# Patient Record
Sex: Female | Born: 1945 | Race: White | Hispanic: No | Smoking: Current every day smoker
Health system: Southern US, Community
[De-identification: ages and names within clinical notes are randomized; demographics above are authoritative.]

## PROBLEM LIST (undated history)

## (undated) DIAGNOSIS — F32A Depression, unspecified: Secondary | ICD-10-CM

## (undated) DIAGNOSIS — I480 Paroxysmal atrial fibrillation: Secondary | ICD-10-CM

## (undated) DIAGNOSIS — E119 Type 2 diabetes mellitus without complications: Secondary | ICD-10-CM

## (undated) DIAGNOSIS — I10 Essential (primary) hypertension: Secondary | ICD-10-CM

## (undated) DIAGNOSIS — F329 Major depressive disorder, single episode, unspecified: Secondary | ICD-10-CM

## (undated) HISTORY — DX: Paroxysmal atrial fibrillation: I48.0

## (undated) HISTORY — DX: Type 2 diabetes mellitus without complications: E11.9

## (undated) HISTORY — DX: Depression, unspecified: F32.A

## (undated) HISTORY — DX: Major depressive disorder, single episode, unspecified: F32.9

## (undated) HISTORY — DX: Essential (primary) hypertension: I10

---

## 2009-04-26 ENCOUNTER — Ambulatory Visit (HOSPITAL_COMMUNITY): Admission: RE | Admit: 2009-04-26 | Discharge: 2009-04-26 | Payer: Self-pay | Admitting: Ophthalmology

## 2009-08-03 ENCOUNTER — Ambulatory Visit (HOSPITAL_COMMUNITY): Admission: RE | Admit: 2009-08-03 | Discharge: 2009-08-03 | Payer: Self-pay | Admitting: Ophthalmology

## 2010-12-24 ENCOUNTER — Encounter: Payer: Self-pay | Admitting: Cardiology

## 2010-12-24 ENCOUNTER — Encounter: Payer: Self-pay | Admitting: Physician Assistant

## 2010-12-25 ENCOUNTER — Encounter: Payer: Self-pay | Admitting: Cardiology

## 2011-01-02 NOTE — Consult Note (Signed)
Summary: Consultation Report/ CARDIOLOGY  Consultation Report/ CARDIOLOGY   Imported By: Dorise Hiss 12/27/2010 10:36:16  _____________________________________________________________________  External Attachment:    Type:   Image     Comment:   External Document

## 2011-01-19 DIAGNOSIS — R002 Palpitations: Secondary | ICD-10-CM

## 2011-01-30 DIAGNOSIS — F341 Dysthymic disorder: Secondary | ICD-10-CM | POA: Insufficient documentation

## 2011-01-30 DIAGNOSIS — I10 Essential (primary) hypertension: Secondary | ICD-10-CM | POA: Insufficient documentation

## 2011-01-30 DIAGNOSIS — E119 Type 2 diabetes mellitus without complications: Secondary | ICD-10-CM | POA: Insufficient documentation

## 2011-01-30 DIAGNOSIS — I4891 Unspecified atrial fibrillation: Secondary | ICD-10-CM | POA: Insufficient documentation

## 2011-01-30 DIAGNOSIS — R55 Syncope and collapse: Secondary | ICD-10-CM | POA: Insufficient documentation

## 2011-01-30 DIAGNOSIS — Z87891 Personal history of nicotine dependence: Secondary | ICD-10-CM | POA: Insufficient documentation

## 2011-01-31 ENCOUNTER — Encounter: Payer: Self-pay | Admitting: Cardiology

## 2011-01-31 ENCOUNTER — Encounter (INDEPENDENT_AMBULATORY_CARE_PROVIDER_SITE_OTHER): Payer: BC Managed Care – PPO | Admitting: Cardiology

## 2011-01-31 DIAGNOSIS — R55 Syncope and collapse: Secondary | ICD-10-CM

## 2011-01-31 DIAGNOSIS — R002 Palpitations: Secondary | ICD-10-CM

## 2011-02-05 NOTE — Consult Note (Signed)
Summary: CARDIOLOGY CONSULT/ [facility]  CARDIOLOGY CONSULT/ [facility]   Imported By: Zachary George 01/30/2011 09:54:22  _____________________________________________________________________  External Attachment:    Type:   Image     Comment:   External Document

## 2011-02-05 NOTE — Letter (Signed)
Summary: [facility] D/C DR. HASANAJ  [facility] D/C DR. HASANAJ   Imported By: Zachary George 01/30/2011 09:56:05  _____________________________________________________________________  External Attachment:    Type:   Image     Comment:   External Document

## 2011-02-05 NOTE — Medication Information (Signed)
Summary: [facility] D/C MEDICATION SHEET ORDER  [facility] D/C MEDICATION SHEET ORDER   Imported By: Zachary George 01/30/2011 09:54:49  _____________________________________________________________________  External Attachment:    Type:   Image     Comment:   External Document

## 2011-02-12 NOTE — Assessment & Plan Note (Signed)
Summary: eph d/c [facility] 1-31-syncope-Dr.Hasanaj-vs   Visit Type:  Follow-up Primary Provider:  Lita Mains   History of Present Illness: The patient was recently hospitalized for near syncope .  She ruled out for myocardial infarction.  The patient has history of depression diabetes mellitus and hypertension.  Stress echocardiogram was performed which was within normal limits.  Carotid Dopplers also showed less than 50% stenosis bilaterally.  The patient was seen in consultation.  We do not feel the patient was orthostatic.  She did have multiple risk factors.  The patient has been wearing a cardiac monitor.  She had a brief run of supraventricular tachycardia.  This was asymptomatic.  No evidence of active fibrillation.  The patient will need monitoring for an additional week. Electrocardiogram today was within normal limits.   Preventive Screening-Counseling & Management  Alcohol-Tobacco     Smoking Status: quit     Year Quit: 12/2006     Pack years: 1-2 packs day times 46 years  Current Medications (verified): 1)  Aspir-Low 81 Mg Tbec (Aspirin) .... Take 1 Tablet By Mouth Once A Day 2)  Lisinopril-Hydrochlorothiazide 10-12.5 Mg Tabs (Lisinopril-Hydrochlorothiazide) .... Take 1 Tablet By Mouth Once A Day 3)  Citalopram Hydrobromide 20 Mg Tabs (Citalopram Hydrobromide) .... Take 1 Tablet By Mouth Once A Day 4)  Alprazolam 1 Mg Tabs (Alprazolam) .... Take 1 Tablet By Mouth Two Times A Day 5)  Glipizide 5 Mg Tabs (Glipizide) .... Take 1 Tablet By Mouth Once A Day  Allergies (verified): No Known Drug Allergies  Comments:  Nurse/Medical Assistant: The patient's medication bottles and allergies were reviewed with the patient and were updated in the Medication and Allergy Lists.  Past History:  Past Medical History: Last updated: 02/03/11 Diabetes Type 2 Hypertension History of tobacco Atrial Fibrillation Paroxysmal in 2008     a. spontaneously converted to  NSR Depression  Past Surgical History: Last updated: Feb 03, 2011 Hysterectomy  Family History: Last updated: Feb 03, 2011 Father: deceased age 62 sudden death, HX of CHF sister age 79 status Post MI age 49  Social History: Last updated: Feb 03, 2011 Lives in [place] with her son Tobacco Use - Former. quit approx 4 yrs ago (started smoking age 71)  Quit drinking approx 4 yrs ago  Risk Factors: Smoking Status: quit (01/31/2011)  Social History: Pack years:  1-2 packs day times 46 years  Vital Signs:  Patient profile:   65 year old female Height:      61 inches Weight:      137 pounds BMI:     25.98 Pulse rate:   56 / minute BP sitting:   91 / 50  (left arm) Cuff size:   regular  Vitals Entered By: Carlye Grippe (January 31, 2011 12:59 PM)  Nutrition Counseling: Patient's BMI is greater than 25 and therefore counseled on weight management options.  Physical Exam  Additional Exam:  General: Well-developed, well-nourished in no distress head: Normocephalic and atraumatic eyes PERRLA/EOMI intact, conjunctiva and lids normal nose: No deformity or lesions mouth normal dentition, normal posterior pharynx neck: Supple, no JVD.  No masses, thyromegaly or abnormal cervical nodes lungs: Normal breath sounds bilaterally without wheezing.  Normal percussion heart: regular rate and rhythm with normal S1 and S2, no S3 or S4.  PMI is normal.  No pathological murmurs abdomen: Normal bowel sounds, abdomen is soft and nontender without masses, organomegaly or hernias noted.  No hepatosplenomegaly musculoskeletal: Back normal, normal gait muscle strength and tone normal pulsus: Pulse is normal in  all 4 extremities Extremities: No peripheral pitting edema neurologic: Alert and oriented x 3 skin: Intact without lesions or rashes cervical nodes: No significant adenopathy psychologic: Normal affect    Impression & Recommendations:  Problem # 1:  SYNCOPE AND COLLAPSE  (ICD-780.2) near syncope no evidence of significant arrhythmias.  The patient will follow for final report on her cardiac monitor. Her updated medication list for this problem includes:    Aspir-low 81 Mg Tbec (Aspirin) .Marland Kitchen... Take 1 tablet by mouth once a day    Lisinopril-hydrochlorothiazide 10-12.5 Mg Tabs (Lisinopril-hydrochlorothiazide) .Marland Kitchen... Take 1 tablet by mouth once a day  Problem # 2:  PAROXYSMAL ATRIAL FIBRILLATION (ICD-427.31) history of paroxysmal atrial fibrillation: No evidence of atrial fibrillation. Her updated medication list for this problem includes:    Aspir-low 81 Mg Tbec (Aspirin) .Marland Kitchen... Take 1 tablet by mouth once a day  Orders: EKG w/ Interpretation (93000)  Problem # 3:  DM (ICD-250.00) diabetes mellitus: Followed by the patient's primary care physician. Her updated medication list for this problem includes:    Aspir-low 81 Mg Tbec (Aspirin) .Marland Kitchen... Take 1 tablet by mouth once a day    Lisinopril-hydrochlorothiazide 10-12.5 Mg Tabs (Lisinopril-hydrochlorothiazide) .Marland Kitchen... Take 1 tablet by mouth once a day    Glipizide 5 Mg Tabs (Glipizide) .Marland Kitchen... Take 1 tablet by mouth once a day  Patient Instructions: 1)  Your physician recommends that you continue on your current medications as directed. Please refer to the Current Medication list given to you today. 2)  Follow up in  6 months

## 2011-03-01 LAB — POCT I-STAT 4, (NA,K, GLUC, HGB,HCT): Potassium: 4.2 mEq/L (ref 3.5–5.1)

## 2011-03-04 ENCOUNTER — Encounter: Payer: Self-pay | Admitting: Cardiology

## 2011-03-05 LAB — BASIC METABOLIC PANEL
BUN: 8 mg/dL (ref 6–23)
Chloride: 97 mEq/L (ref 96–112)
GFR calc non Af Amer: >60 mL/min (ref >60)
Glucose, Bld: 81 mg/dL (ref 70–99)
Potassium: 4.9 mEq/L (ref 3.5–5.1)
Sodium: 133 mEq/L — ABNORMAL LOW (ref 135–145)

## 2011-03-05 LAB — HEMOGLOBIN AND HEMATOCRIT, BLOOD: Hemoglobin: 12.7 g/dL (ref 12.0–15.0)

## 2012-06-11 ENCOUNTER — Encounter: Payer: Self-pay | Admitting: Cardiology

## 2015-08-29 DIAGNOSIS — Z23 Encounter for immunization: Secondary | ICD-10-CM | POA: Diagnosis not present

## 2015-10-02 DIAGNOSIS — E7800 Pure hypercholesterolemia, unspecified: Secondary | ICD-10-CM | POA: Diagnosis not present

## 2015-10-02 DIAGNOSIS — Z87891 Personal history of nicotine dependence: Secondary | ICD-10-CM | POA: Diagnosis not present

## 2015-10-02 DIAGNOSIS — Z780 Asymptomatic menopausal state: Secondary | ICD-10-CM | POA: Diagnosis not present

## 2015-10-02 DIAGNOSIS — Z79899 Other long term (current) drug therapy: Secondary | ICD-10-CM | POA: Diagnosis not present

## 2015-10-02 DIAGNOSIS — I10 Essential (primary) hypertension: Secondary | ICD-10-CM | POA: Diagnosis not present

## 2015-10-02 DIAGNOSIS — R69 Illness, unspecified: Secondary | ICD-10-CM | POA: Diagnosis not present

## 2015-10-02 DIAGNOSIS — M810 Age-related osteoporosis without current pathological fracture: Secondary | ICD-10-CM | POA: Diagnosis not present

## 2015-10-30 DIAGNOSIS — M810 Age-related osteoporosis without current pathological fracture: Secondary | ICD-10-CM | POA: Diagnosis not present

## 2015-11-04 DIAGNOSIS — Z7982 Long term (current) use of aspirin: Secondary | ICD-10-CM | POA: Diagnosis not present

## 2015-11-04 DIAGNOSIS — R21 Rash and other nonspecific skin eruption: Secondary | ICD-10-CM | POA: Diagnosis not present

## 2015-11-04 DIAGNOSIS — R69 Illness, unspecified: Secondary | ICD-10-CM | POA: Diagnosis not present

## 2015-11-04 DIAGNOSIS — I10 Essential (primary) hypertension: Secondary | ICD-10-CM | POA: Diagnosis not present

## 2015-11-04 DIAGNOSIS — Z7984 Long term (current) use of oral hypoglycemic drugs: Secondary | ICD-10-CM | POA: Diagnosis not present

## 2015-11-04 DIAGNOSIS — E109 Type 1 diabetes mellitus without complications: Secondary | ICD-10-CM | POA: Diagnosis not present

## 2015-11-04 DIAGNOSIS — Z79899 Other long term (current) drug therapy: Secondary | ICD-10-CM | POA: Diagnosis not present

## 2015-11-04 DIAGNOSIS — L259 Unspecified contact dermatitis, unspecified cause: Secondary | ICD-10-CM | POA: Diagnosis not present

## 2015-11-07 DIAGNOSIS — M810 Age-related osteoporosis without current pathological fracture: Secondary | ICD-10-CM | POA: Diagnosis not present

## 2015-11-07 DIAGNOSIS — Z6825 Body mass index (BMI) 25.0-25.9, adult: Secondary | ICD-10-CM | POA: Diagnosis not present

## 2015-11-07 DIAGNOSIS — I10 Essential (primary) hypertension: Secondary | ICD-10-CM | POA: Diagnosis not present

## 2015-11-07 DIAGNOSIS — E119 Type 2 diabetes mellitus without complications: Secondary | ICD-10-CM | POA: Diagnosis not present

## 2016-01-30 DIAGNOSIS — M810 Age-related osteoporosis without current pathological fracture: Secondary | ICD-10-CM | POA: Diagnosis not present

## 2016-01-30 DIAGNOSIS — E119 Type 2 diabetes mellitus without complications: Secondary | ICD-10-CM | POA: Diagnosis not present

## 2016-01-30 DIAGNOSIS — L308 Other specified dermatitis: Secondary | ICD-10-CM | POA: Diagnosis not present

## 2016-01-30 DIAGNOSIS — I10 Essential (primary) hypertension: Secondary | ICD-10-CM | POA: Diagnosis not present

## 2016-05-02 DIAGNOSIS — J440 Chronic obstructive pulmonary disease with acute lower respiratory infection: Secondary | ICD-10-CM | POA: Diagnosis not present

## 2016-05-02 DIAGNOSIS — Z6823 Body mass index (BMI) 23.0-23.9, adult: Secondary | ICD-10-CM | POA: Diagnosis not present

## 2016-05-02 DIAGNOSIS — E119 Type 2 diabetes mellitus without complications: Secondary | ICD-10-CM | POA: Diagnosis not present

## 2016-05-02 DIAGNOSIS — I10 Essential (primary) hypertension: Secondary | ICD-10-CM | POA: Diagnosis not present

## 2016-05-02 DIAGNOSIS — L308 Other specified dermatitis: Secondary | ICD-10-CM | POA: Diagnosis not present

## 2016-05-02 DIAGNOSIS — M810 Age-related osteoporosis without current pathological fracture: Secondary | ICD-10-CM | POA: Diagnosis not present

## 2016-07-22 DIAGNOSIS — Z1231 Encounter for screening mammogram for malignant neoplasm of breast: Secondary | ICD-10-CM | POA: Diagnosis not present

## 2016-08-02 DIAGNOSIS — E784 Other hyperlipidemia: Secondary | ICD-10-CM | POA: Diagnosis not present

## 2016-08-02 DIAGNOSIS — Z1389 Encounter for screening for other disorder: Secondary | ICD-10-CM | POA: Diagnosis not present

## 2016-08-02 DIAGNOSIS — R7303 Prediabetes: Secondary | ICD-10-CM | POA: Diagnosis not present

## 2016-08-02 DIAGNOSIS — I10 Essential (primary) hypertension: Secondary | ICD-10-CM | POA: Diagnosis not present

## 2016-08-02 DIAGNOSIS — Z0000 Encounter for general adult medical examination without abnormal findings: Secondary | ICD-10-CM | POA: Diagnosis not present

## 2016-08-02 DIAGNOSIS — Z6823 Body mass index (BMI) 23.0-23.9, adult: Secondary | ICD-10-CM | POA: Diagnosis not present

## 2016-08-02 DIAGNOSIS — R69 Illness, unspecified: Secondary | ICD-10-CM | POA: Diagnosis not present

## 2016-09-11 DIAGNOSIS — Z23 Encounter for immunization: Secondary | ICD-10-CM | POA: Diagnosis not present

## 2016-09-29 DIAGNOSIS — R69 Illness, unspecified: Secondary | ICD-10-CM | POA: Diagnosis not present

## 2016-11-01 DIAGNOSIS — E784 Other hyperlipidemia: Secondary | ICD-10-CM | POA: Diagnosis not present

## 2016-11-01 DIAGNOSIS — I10 Essential (primary) hypertension: Secondary | ICD-10-CM | POA: Diagnosis not present

## 2016-11-01 DIAGNOSIS — Z6823 Body mass index (BMI) 23.0-23.9, adult: Secondary | ICD-10-CM | POA: Diagnosis not present

## 2016-11-01 DIAGNOSIS — R69 Illness, unspecified: Secondary | ICD-10-CM | POA: Diagnosis not present

## 2017-01-31 DIAGNOSIS — I10 Essential (primary) hypertension: Secondary | ICD-10-CM | POA: Diagnosis not present

## 2017-01-31 DIAGNOSIS — E784 Other hyperlipidemia: Secondary | ICD-10-CM | POA: Diagnosis not present

## 2017-01-31 DIAGNOSIS — R69 Illness, unspecified: Secondary | ICD-10-CM | POA: Diagnosis not present

## 2017-01-31 DIAGNOSIS — Z131 Encounter for screening for diabetes mellitus: Secondary | ICD-10-CM | POA: Diagnosis not present

## 2017-01-31 DIAGNOSIS — Z6824 Body mass index (BMI) 24.0-24.9, adult: Secondary | ICD-10-CM | POA: Diagnosis not present

## 2017-02-13 DIAGNOSIS — Z0000 Encounter for general adult medical examination without abnormal findings: Secondary | ICD-10-CM | POA: Diagnosis not present

## 2017-02-13 DIAGNOSIS — Z6823 Body mass index (BMI) 23.0-23.9, adult: Secondary | ICD-10-CM | POA: Diagnosis not present

## 2017-02-13 DIAGNOSIS — H9193 Unspecified hearing loss, bilateral: Secondary | ICD-10-CM | POA: Diagnosis not present

## 2017-02-13 DIAGNOSIS — E7800 Pure hypercholesterolemia, unspecified: Secondary | ICD-10-CM | POA: Diagnosis not present

## 2017-02-13 DIAGNOSIS — Z79899 Other long term (current) drug therapy: Secondary | ICD-10-CM | POA: Diagnosis not present

## 2017-02-13 DIAGNOSIS — K08109 Complete loss of teeth, unspecified cause, unspecified class: Secondary | ICD-10-CM | POA: Diagnosis not present

## 2017-02-13 DIAGNOSIS — Z87891 Personal history of nicotine dependence: Secondary | ICD-10-CM | POA: Diagnosis not present

## 2017-02-13 DIAGNOSIS — Z972 Presence of dental prosthetic device (complete) (partial): Secondary | ICD-10-CM | POA: Diagnosis not present

## 2017-02-13 DIAGNOSIS — I10 Essential (primary) hypertension: Secondary | ICD-10-CM | POA: Diagnosis not present

## 2017-02-13 DIAGNOSIS — R69 Illness, unspecified: Secondary | ICD-10-CM | POA: Diagnosis not present

## 2017-05-02 DIAGNOSIS — R69 Illness, unspecified: Secondary | ICD-10-CM | POA: Diagnosis not present

## 2017-05-02 DIAGNOSIS — L2389 Allergic contact dermatitis due to other agents: Secondary | ICD-10-CM | POA: Diagnosis not present

## 2017-05-02 DIAGNOSIS — I10 Essential (primary) hypertension: Secondary | ICD-10-CM | POA: Diagnosis not present

## 2017-05-02 DIAGNOSIS — Z6824 Body mass index (BMI) 24.0-24.9, adult: Secondary | ICD-10-CM | POA: Diagnosis not present

## 2017-05-02 DIAGNOSIS — E784 Other hyperlipidemia: Secondary | ICD-10-CM | POA: Diagnosis not present

## 2017-05-16 DIAGNOSIS — H11823 Conjunctivochalasis, bilateral: Secondary | ICD-10-CM | POA: Diagnosis not present

## 2017-05-16 DIAGNOSIS — H3540 Unspecified peripheral retinal degeneration: Secondary | ICD-10-CM | POA: Diagnosis not present

## 2017-05-16 DIAGNOSIS — I10 Essential (primary) hypertension: Secondary | ICD-10-CM | POA: Diagnosis not present

## 2017-05-16 DIAGNOSIS — H524 Presbyopia: Secondary | ICD-10-CM | POA: Diagnosis not present

## 2017-05-16 DIAGNOSIS — Z961 Presence of intraocular lens: Secondary | ICD-10-CM | POA: Diagnosis not present

## 2017-05-16 DIAGNOSIS — Z9849 Cataract extraction status, unspecified eye: Secondary | ICD-10-CM | POA: Diagnosis not present

## 2017-05-16 DIAGNOSIS — H35031 Hypertensive retinopathy, right eye: Secondary | ICD-10-CM | POA: Diagnosis not present

## 2017-05-16 DIAGNOSIS — H35371 Puckering of macula, right eye: Secondary | ICD-10-CM | POA: Diagnosis not present

## 2017-05-16 DIAGNOSIS — H35033 Hypertensive retinopathy, bilateral: Secondary | ICD-10-CM | POA: Diagnosis not present

## 2017-05-16 DIAGNOSIS — R7303 Prediabetes: Secondary | ICD-10-CM | POA: Diagnosis not present

## 2017-05-16 DIAGNOSIS — H35372 Puckering of macula, left eye: Secondary | ICD-10-CM | POA: Diagnosis not present

## 2017-08-05 DIAGNOSIS — I10 Essential (primary) hypertension: Secondary | ICD-10-CM | POA: Diagnosis not present

## 2017-08-05 DIAGNOSIS — E784 Other hyperlipidemia: Secondary | ICD-10-CM | POA: Diagnosis not present

## 2017-08-05 DIAGNOSIS — Z6824 Body mass index (BMI) 24.0-24.9, adult: Secondary | ICD-10-CM | POA: Diagnosis not present

## 2017-08-05 DIAGNOSIS — R69 Illness, unspecified: Secondary | ICD-10-CM | POA: Diagnosis not present

## 2017-09-04 DIAGNOSIS — R69 Illness, unspecified: Secondary | ICD-10-CM | POA: Diagnosis not present

## 2017-11-07 DIAGNOSIS — R69 Illness, unspecified: Secondary | ICD-10-CM | POA: Diagnosis not present

## 2017-11-07 DIAGNOSIS — E7849 Other hyperlipidemia: Secondary | ICD-10-CM | POA: Diagnosis not present

## 2017-11-07 DIAGNOSIS — J40 Bronchitis, not specified as acute or chronic: Secondary | ICD-10-CM | POA: Diagnosis not present

## 2017-11-07 DIAGNOSIS — Z6823 Body mass index (BMI) 23.0-23.9, adult: Secondary | ICD-10-CM | POA: Diagnosis not present

## 2017-11-07 DIAGNOSIS — I10 Essential (primary) hypertension: Secondary | ICD-10-CM | POA: Diagnosis not present

## 2017-11-12 DIAGNOSIS — I10 Essential (primary) hypertension: Secondary | ICD-10-CM | POA: Diagnosis not present

## 2018-02-04 DIAGNOSIS — R69 Illness, unspecified: Secondary | ICD-10-CM | POA: Diagnosis not present

## 2018-02-04 DIAGNOSIS — Z6823 Body mass index (BMI) 23.0-23.9, adult: Secondary | ICD-10-CM | POA: Diagnosis not present

## 2018-02-04 DIAGNOSIS — I10 Essential (primary) hypertension: Secondary | ICD-10-CM | POA: Diagnosis not present

## 2018-02-04 DIAGNOSIS — E7849 Other hyperlipidemia: Secondary | ICD-10-CM | POA: Diagnosis not present

## 2018-05-11 DIAGNOSIS — Z0000 Encounter for general adult medical examination without abnormal findings: Secondary | ICD-10-CM | POA: Diagnosis not present

## 2018-05-11 DIAGNOSIS — Z6823 Body mass index (BMI) 23.0-23.9, adult: Secondary | ICD-10-CM | POA: Diagnosis not present

## 2018-05-11 DIAGNOSIS — Z1389 Encounter for screening for other disorder: Secondary | ICD-10-CM | POA: Diagnosis not present

## 2018-05-11 DIAGNOSIS — I10 Essential (primary) hypertension: Secondary | ICD-10-CM | POA: Diagnosis not present

## 2018-05-11 DIAGNOSIS — E7849 Other hyperlipidemia: Secondary | ICD-10-CM | POA: Diagnosis not present

## 2018-05-11 DIAGNOSIS — R69 Illness, unspecified: Secondary | ICD-10-CM | POA: Diagnosis not present

## 2018-06-16 DIAGNOSIS — M810 Age-related osteoporosis without current pathological fracture: Secondary | ICD-10-CM | POA: Diagnosis not present

## 2018-06-16 DIAGNOSIS — E2839 Other primary ovarian failure: Secondary | ICD-10-CM | POA: Diagnosis not present

## 2018-07-23 DIAGNOSIS — J440 Chronic obstructive pulmonary disease with acute lower respiratory infection: Secondary | ICD-10-CM | POA: Diagnosis not present

## 2018-07-23 DIAGNOSIS — I10 Essential (primary) hypertension: Secondary | ICD-10-CM | POA: Diagnosis not present

## 2018-07-23 DIAGNOSIS — Z1389 Encounter for screening for other disorder: Secondary | ICD-10-CM | POA: Diagnosis not present

## 2018-07-23 DIAGNOSIS — Z6823 Body mass index (BMI) 23.0-23.9, adult: Secondary | ICD-10-CM | POA: Diagnosis not present

## 2018-07-23 DIAGNOSIS — R69 Illness, unspecified: Secondary | ICD-10-CM | POA: Diagnosis not present

## 2018-07-23 DIAGNOSIS — Z0000 Encounter for general adult medical examination without abnormal findings: Secondary | ICD-10-CM | POA: Diagnosis not present

## 2018-07-23 DIAGNOSIS — Z6821 Body mass index (BMI) 21.0-21.9, adult: Secondary | ICD-10-CM | POA: Diagnosis not present

## 2018-07-23 DIAGNOSIS — E7849 Other hyperlipidemia: Secondary | ICD-10-CM | POA: Diagnosis not present

## 2018-07-23 DIAGNOSIS — Z1159 Encounter for screening for other viral diseases: Secondary | ICD-10-CM | POA: Diagnosis not present

## 2018-07-23 DIAGNOSIS — R7303 Prediabetes: Secondary | ICD-10-CM | POA: Diagnosis not present

## 2018-08-21 DIAGNOSIS — Z720 Tobacco use: Secondary | ICD-10-CM | POA: Diagnosis not present

## 2018-08-21 DIAGNOSIS — Z79899 Other long term (current) drug therapy: Secondary | ICD-10-CM | POA: Diagnosis not present

## 2018-08-21 DIAGNOSIS — R531 Weakness: Secondary | ICD-10-CM | POA: Diagnosis not present

## 2018-08-21 DIAGNOSIS — R0789 Other chest pain: Secondary | ICD-10-CM | POA: Diagnosis not present

## 2018-08-21 DIAGNOSIS — R69 Illness, unspecified: Secondary | ICD-10-CM | POA: Diagnosis not present

## 2018-08-21 DIAGNOSIS — R079 Chest pain, unspecified: Secondary | ICD-10-CM | POA: Diagnosis not present

## 2018-08-21 DIAGNOSIS — E119 Type 2 diabetes mellitus without complications: Secondary | ICD-10-CM | POA: Diagnosis not present

## 2018-08-21 DIAGNOSIS — E7800 Pure hypercholesterolemia, unspecified: Secondary | ICD-10-CM | POA: Diagnosis not present

## 2018-08-21 DIAGNOSIS — I10 Essential (primary) hypertension: Secondary | ICD-10-CM | POA: Diagnosis not present

## 2018-08-21 DIAGNOSIS — J441 Chronic obstructive pulmonary disease with (acute) exacerbation: Secondary | ICD-10-CM | POA: Diagnosis not present

## 2018-08-21 DIAGNOSIS — R197 Diarrhea, unspecified: Secondary | ICD-10-CM | POA: Diagnosis not present

## 2018-08-25 DIAGNOSIS — E785 Hyperlipidemia, unspecified: Secondary | ICD-10-CM | POA: Diagnosis not present

## 2018-08-25 DIAGNOSIS — R69 Illness, unspecified: Secondary | ICD-10-CM | POA: Diagnosis not present

## 2018-08-25 DIAGNOSIS — F411 Generalized anxiety disorder: Secondary | ICD-10-CM | POA: Diagnosis not present

## 2018-08-25 DIAGNOSIS — I10 Essential (primary) hypertension: Secondary | ICD-10-CM | POA: Diagnosis not present

## 2018-08-25 DIAGNOSIS — E876 Hypokalemia: Secondary | ICD-10-CM | POA: Diagnosis not present

## 2018-08-25 DIAGNOSIS — J449 Chronic obstructive pulmonary disease, unspecified: Secondary | ICD-10-CM | POA: Diagnosis not present

## 2018-08-25 DIAGNOSIS — E871 Hypo-osmolality and hyponatremia: Secondary | ICD-10-CM | POA: Diagnosis not present

## 2018-08-25 DIAGNOSIS — R109 Unspecified abdominal pain: Secondary | ICD-10-CM | POA: Diagnosis not present

## 2018-08-25 DIAGNOSIS — K5900 Constipation, unspecified: Secondary | ICD-10-CM | POA: Diagnosis not present

## 2018-08-25 DIAGNOSIS — Z780 Asymptomatic menopausal state: Secondary | ICD-10-CM | POA: Diagnosis not present

## 2018-08-25 DIAGNOSIS — E119 Type 2 diabetes mellitus without complications: Secondary | ICD-10-CM | POA: Diagnosis not present

## 2018-08-26 DIAGNOSIS — K5900 Constipation, unspecified: Secondary | ICD-10-CM | POA: Diagnosis not present

## 2018-08-26 DIAGNOSIS — J449 Chronic obstructive pulmonary disease, unspecified: Secondary | ICD-10-CM | POA: Diagnosis not present

## 2018-08-26 DIAGNOSIS — E876 Hypokalemia: Secondary | ICD-10-CM | POA: Diagnosis not present

## 2018-08-26 DIAGNOSIS — R109 Unspecified abdominal pain: Secondary | ICD-10-CM | POA: Diagnosis not present

## 2018-08-26 DIAGNOSIS — E871 Hypo-osmolality and hyponatremia: Secondary | ICD-10-CM | POA: Diagnosis not present

## 2018-08-31 DIAGNOSIS — R1084 Generalized abdominal pain: Secondary | ICD-10-CM | POA: Diagnosis not present

## 2018-08-31 DIAGNOSIS — Z681 Body mass index (BMI) 19 or less, adult: Secondary | ICD-10-CM | POA: Diagnosis not present

## 2018-10-11 ENCOUNTER — Emergency Department (HOSPITAL_COMMUNITY)
Admission: EM | Admit: 2018-10-11 | Discharge: 2018-10-12 | Disposition: A | Discharge status: Other Acute Inpatient Hospital | Payer: Medicare HMO | Attending: Emergency Medicine | Admitting: Emergency Medicine

## 2018-10-11 ENCOUNTER — Encounter (HOSPITAL_COMMUNITY): Payer: Self-pay

## 2018-10-11 ENCOUNTER — Other Ambulatory Visit: Payer: Self-pay

## 2018-10-11 DIAGNOSIS — E119 Type 2 diabetes mellitus without complications: Secondary | ICD-10-CM | POA: Diagnosis not present

## 2018-10-11 DIAGNOSIS — R0989 Other specified symptoms and signs involving the circulatory and respiratory systems: Secondary | ICD-10-CM | POA: Diagnosis not present

## 2018-10-11 DIAGNOSIS — I10 Essential (primary) hypertension: Secondary | ICD-10-CM | POA: Insufficient documentation

## 2018-10-11 DIAGNOSIS — F17200 Nicotine dependence, unspecified, uncomplicated: Secondary | ICD-10-CM | POA: Insufficient documentation

## 2018-10-11 DIAGNOSIS — L03011 Cellulitis of right finger: Secondary | ICD-10-CM | POA: Diagnosis not present

## 2018-10-11 DIAGNOSIS — F329 Major depressive disorder, single episode, unspecified: Secondary | ICD-10-CM | POA: Diagnosis present

## 2018-10-11 DIAGNOSIS — F331 Major depressive disorder, recurrent, moderate: Secondary | ICD-10-CM | POA: Diagnosis not present

## 2018-10-11 DIAGNOSIS — R42 Dizziness and giddiness: Secondary | ICD-10-CM | POA: Diagnosis not present

## 2018-10-11 DIAGNOSIS — Z79899 Other long term (current) drug therapy: Secondary | ICD-10-CM | POA: Diagnosis not present

## 2018-10-11 DIAGNOSIS — R5383 Other fatigue: Secondary | ICD-10-CM | POA: Diagnosis not present

## 2018-10-11 DIAGNOSIS — R45851 Suicidal ideations: Secondary | ICD-10-CM

## 2018-10-11 DIAGNOSIS — R69 Illness, unspecified: Secondary | ICD-10-CM | POA: Diagnosis not present

## 2018-10-11 LAB — COMPREHENSIVE METABOLIC PANEL
ALBUMIN: 4.5 g/dL (ref 3.5–5.0)
ALT: 13 U/L (ref 0–44)
ANION GAP: 10 (ref 5–15)
AST: 17 U/L (ref 15–41)
Alkaline Phosphatase: 51 U/L (ref 38–126)
BUN: 7 mg/dL — ABNORMAL LOW (ref 8–23)
CO2: 23 mmol/L (ref 22–32)
Calcium: 9.9 mg/dL (ref 8.9–10.3)
Chloride: 100 mmol/L (ref 98–111)
Creatinine, Ser: 0.54 mg/dL (ref 0.44–1.00)
GFR calc Af Amer: >60 mL/min (ref >60)
GFR calc non Af Amer: >60 mL/min (ref >60)
GLUCOSE: 128 mg/dL — AB (ref 70–99)
POTASSIUM: 3.5 mmol/L (ref 3.5–5.1)
Sodium: 133 mmol/L — ABNORMAL LOW (ref 135–145)
TOTAL PROTEIN: 7.1 g/dL (ref 6.5–8.1)
Total Bilirubin: 1 mg/dL (ref 0.3–1.2)

## 2018-10-11 LAB — URINALYSIS, ROUTINE W REFLEX MICROSCOPIC
BILIRUBIN URINE: NEGATIVE
Glucose, UA: NEGATIVE mg/dL
Ketones, ur: 5 mg/dL — AB
Leukocytes, UA: NEGATIVE
Nitrite: NEGATIVE
PROTEIN: NEGATIVE mg/dL
Specific Gravity, Urine: 1.013 (ref 1.005–1.030)
pH: 6 (ref 5.0–8.0)

## 2018-10-11 LAB — CBC
HCT: 42.1 % (ref 36.0–46.0)
Hemoglobin: 14 g/dL (ref 12.0–15.0)
MCH: 29.5 pg (ref 26.0–34.0)
MCHC: 33.3 g/dL (ref 30.0–36.0)
MCV: 88.8 fL (ref 80.0–100.0)
NRBC: 0 % (ref 0.0–0.2)
PLATELETS: 339 10*3/uL (ref 150–400)
RBC: 4.74 MIL/uL (ref 3.87–5.11)
RDW: 13.8 % (ref 11.5–15.5)
WBC: 7.1 10*3/uL (ref 4.0–10.5)

## 2018-10-11 LAB — RAPID URINE DRUG SCREEN, HOSP PERFORMED
AMPHETAMINES: NOT DETECTED
BENZODIAZEPINES: POSITIVE — AB
Barbiturates: NOT DETECTED
COCAINE: NOT DETECTED
Opiates: NOT DETECTED
Tetrahydrocannabinol: NOT DETECTED

## 2018-10-11 LAB — SALICYLATE LEVEL

## 2018-10-11 LAB — ACETAMINOPHEN LEVEL: Acetaminophen (Tylenol), Serum: <10 ug/mL — ABNORMAL LOW (ref 10–30)

## 2018-10-11 LAB — ETHANOL: Alcohol, Ethyl (B): <10 mg/dL (ref <10)

## 2018-10-11 MED ORDER — CEPHALEXIN 500 MG PO CAPS
500.0000 mg | ORAL_CAPSULE | Freq: Three times a day (TID) | ORAL | Status: DC
  Administered 2018-10-12: 500 mg via ORAL
  Filled 2018-10-11: qty 1

## 2018-10-11 MED ORDER — ACETAMINOPHEN 325 MG PO TABS
650.0000 mg | ORAL_TABLET | Freq: Four times a day (QID) | ORAL | Status: DC | PRN
  Administered 2018-10-11: 650 mg via ORAL
  Administered 2018-10-12: 325 mg via ORAL
  Filled 2018-10-11 (×2): qty 2

## 2018-10-11 MED ORDER — ZOLPIDEM TARTRATE 5 MG PO TABS
5.0000 mg | ORAL_TABLET | Freq: Once | ORAL | Status: AC
  Administered 2018-10-11: 5 mg via ORAL
  Filled 2018-10-11: qty 1

## 2018-10-11 MED ORDER — CEPHALEXIN 500 MG PO CAPS
500.0000 mg | ORAL_CAPSULE | Freq: Once | ORAL | Status: AC
  Administered 2018-10-11: 500 mg via ORAL
  Filled 2018-10-11: qty 1

## 2018-10-11 MED ORDER — SIMVASTATIN 10 MG PO TABS
40.0000 mg | ORAL_TABLET | Freq: Every evening | ORAL | Status: DC
  Administered 2018-10-11: 40 mg via ORAL
  Filled 2018-10-11: qty 4

## 2018-10-11 MED ORDER — BENAZEPRIL HCL 10 MG PO TABS
5.0000 mg | ORAL_TABLET | Freq: Every day | ORAL | Status: DC
  Administered 2018-10-12: 5 mg via ORAL
  Filled 2018-10-11 (×2): qty 0.5
  Filled 2018-10-11: qty 1

## 2018-10-11 NOTE — ED Provider Notes (Signed)
[facility] EMERGENCY DEPARTMENT Provider Note   CSN: [id number] Arrival date & time: 10/11/18  1257     History   Chief Complaint Chief Complaint  Patient presents with  . V70.1    HPI Leslie Matthews is a 72 y.o. female.  HPI Patient's son called surface department after finding patient with handful of Xanax threatening to end her life.  Patient states that she told her son that either she needed to go to the hospital or end her life.  She states she now is not having any suicidal thoughts.  Spoke with patient's son and his girlfriend.  States patient has had vague suicidality for the past several weeks.  Patient has been complaining of loose stool and dizziness.  Currently denying any symptoms. Past Medical History:  Diagnosis Date  . A-fib (HCC)    paroxysmal in 2008- spontaneously converted to NSR  . Depression   . Diabetes mellitus   . History of tobacco use   . Hypertension     Patient Active Problem List   Diagnosis Date Noted  . DM 01/30/2011  . DYSTHYMIC DISORDER 01/30/2011  . HYPERTENSION, BENIGN 01/30/2011  . PAROXYSMAL ATRIAL FIBRILLATION 01/30/2011  . SYNCOPE AND COLLAPSE 01/30/2011  . PERS HX TOBACCO USE PRESENTING HAZARDS HEALTH 01/30/2011    History reviewed. No pertinent surgical history.   OB History   None      Home Medications    Prior to Admission medications   Medication Sig Start Date End Date Taking? Authorizing Provider  ALPRAZolam Prudy Feeler(XANAX) 0.5 MG tablet Take 0.5 mg by mouth at bedtime.  08/10/18  Yes [provider]  benazepril (LOTENSIN) 5 MG tablet Take 5 mg by mouth daily. 09/01/18  Yes [provider]  potassium chloride (KLOR-CON) 8 MEQ tablet Take 8 mEq by mouth daily. 09/21/18  Yes [provider]  simvastatin (ZOCOR) 40 MG tablet Take 40 mg by mouth every evening. 07/23/18  Yes [provider]    Family History Family History  Problem Relation Age of Onset  . Heart failure Father      Social History Social History   Tobacco Use  . Smoking status: Current Every Day Smoker    Packs/day: 2.00  Substance Use Topics  . Alcohol use: No    Comment: HX quit 4 years ago  . Drug use: Never     Allergies   Patient has no known allergies.   Review of Systems Review of Systems  Constitutional: Positive for fatigue. Negative for chills and fever.  Respiratory: Negative for cough and shortness of breath.   Cardiovascular: Negative for chest pain.  Gastrointestinal: Positive for diarrhea. Negative for abdominal pain, blood in stool, constipation, nausea and vomiting.  Genitourinary: Negative for dysuria and flank pain.  Musculoskeletal: Negative for back pain, myalgias and neck pain.  Skin: Negative for rash.  Neurological: Positive for dizziness. Negative for weakness, numbness and headaches.  Psychiatric/Behavioral: Positive for suicidal ideas.  All other systems reviewed and are negative.    Physical Exam Updated Vital Signs BP (!) 166/72 (BP Location: Right Arm)   Pulse 97   Temp 98 F (36.7 C) (Oral)   Resp 18   Wt 62.1 kg   SpO2 97%   BMI 25.87 kg/m   Physical Exam  Constitutional: She is oriented to person, place, and time. She appears well-developed and well-nourished. No distress.  HENT:  Head: Normocephalic and atraumatic.  Mouth/Throat: Oropharynx is clear and moist. No oropharyngeal exudate.  Eyes:  Pupils are equal, round, and reactive to light. EOM are normal.  Neck: Normal range of motion. Neck supple.  Cardiovascular: Normal rate and regular rhythm.  Pulmonary/Chest: Effort normal and breath sounds normal.  Abdominal: Soft. Bowel sounds are normal. She exhibits no distension and no mass. There is no tenderness. There is no rebound and no guarding.  Musculoskeletal: Normal range of motion. She exhibits no edema or tenderness.  Neurological: She is alert and oriented to person, place, and time.  Moving all extremities without focal  deficit.  Sensation intact.  Skin: Skin is warm and dry. No rash noted. She is not diaphoretic. No erythema.  Psychiatric:  Denies SI/HI.  Does not appear to be responding to internal stimuli.  Nursing note and vitals reviewed.    ED Treatments / Results  Labs (all labs ordered are listed, but only abnormal results are displayed) Labs Reviewed  RAPID URINE DRUG SCREEN, HOSP PERFORMED - Abnormal; Notable for the following components:      Result Value   Benzodiazepines POSITIVE (*)    All other components within normal limits  C DIFFICILE QUICK SCREEN W PCR REFLEX  CBC  COMPREHENSIVE METABOLIC PANEL  ETHANOL  SALICYLATE LEVEL  ACETAMINOPHEN LEVEL  URINALYSIS, ROUTINE W REFLEX MICROSCOPIC    EKG EKG Interpretation  Date/Time:  Sunday October 11 2018 14:45:40 EST Ventricular Rate:  80 PR Interval:    QRS Duration: 92 QT Interval:  388 QTC Calculation: 445 R Axis:   -76 Text Interpretation:  Sinus rhythm Left axis deviation Repol abnrm suggests ischemia, diffuse leads Confirmed by Loren Racer ([id number]) on 10/11/2018 7:46:24 PM   Radiology No results found.  Procedures Procedures (including critical care time)  Medications Ordered in ED Medications - No data to display   Initial Impression / Assessment and Plan / ED Course  I have reviewed the triage vital signs and the nursing notes.  Pertinent labs & imaging results that were available during my care of the patient were reviewed by me and considered in my medical decision making (see chart for details).     Work-up is essentially negative.  Call back in the patient's room to reexamine the fifth digit of her right hand.  Denies any known trauma.  States she is had several days of redness and pain.  Finger is erythematous and warm to touch.  No appreciated felon's or paronychia.  Will start on antibiotic for cellulitis.  She will need to be on 7 days worth of antibiotics.  She is medically cleared for  psychiatric evaluation.      Final Clinical Impressions(s) / ED Diagnoses   Final diagnoses:  None    ED Discharge Orders    None       Loren Racer, MD 10/12/18 1323

## 2018-10-11 NOTE — ED Triage Notes (Addendum)
Pts son called the sheriffs department after pt stated she was going to kill herself by taking a handful of xanax. Pt has has stomach issues for 3 weeks and [place] [facility] [place] [facility]. Pt argumentative stating she is not going to stay. While being triaged pt states she did not want to hurt herself

## 2018-10-11 NOTE — BH Assessment (Addendum)
Tele Assessment Note   Patient Name: Leslie Matthews MRN: [id number] Referring Physician:Yelverton, Onalee Hua, MD Location of Patient: [facility] Location of Provider: Behavioral Health TTS Department  Leslie Matthews is a widowed 72 y.o. female who presents involuntarily to [facility] accompanied by a Field seismologist. Pt's son petitioned for IVC due to pt making suicidal statements. Pt reports she has had Depression in the past & "taken pills for it" but doesn't think she is depressed at this time. Pt denies SI- current & past. She states she has 5 grandchildren & 10 great grandchildren & she wants to be around for them. Pt states she continues to enjoy her hobbies of puzzles, crafts & internet. Pt states she hasn't spoken to her sister in a while, she denied there was a problem but appeared sad when she said she should contact her sister. Pt denies current & past alcohol &/or illicit drug use. She reports smoking cigarettes as the worst intentional self harm she does, due to COPD dx.  Pt laughed at possibly being homicidal or having history of violence(& denied). She denies AVH & other sx of psychosis. Pt does identify changes in her physical health as recent stressors. Pt lives in her home & her adult son lives with her. Pt has fair insight and judgment. Pt's memory appears intact. Pt denies legal problems. Pt denies OP MH tx history. Pt denies IP MH tx history.  This Clinical research associate spoke with pt's son by phone. Son reports pt has bottle of xanax (90 pill from March 2019). It had 7 or 8 pills left in it today when pt got agitated, told son and his wife to get out of her face, and threatened to swallow the rest of the bottle with a beer. Son reports pt has always had nonalcoholic beer, in the past 2 weeks she asked him for Miller light bc it would help her sleep. Pt states she has sleep problems due to night sweats. He also reports that pt has been staying inside the house by herself more & being less active than she used to be. While  son doesn't think pt needs to be in a psychiatric hospital, he is concerned she may not be safe from self harm at home alone when he is at work.  ? MSE: Pt is dressed in scrubs, alert, oriented x4 with normal speech and normal motor behavior. Eye contact is good. Pt's mood is euthymic and affect is apprehensive and appropriate to circumstance. Affect is congruent with mood. Thought process is coherent and relevant. There is no indication pt is currently responding to internal stimuli or experiencing delusional thought content. Pt was cooperative throughout assessment.    Disposition:  Per Hillery Jacks, NP, observe overnight for safety and stabilization & be seen by provider in am  Diagnosis: F33.1 Major Depressive Disorder, recurrent, moderate   Past Medical History:  Past Medical History:  Diagnosis Date  . A-fib (HCC)    paroxysmal in 2008- spontaneously converted to NSR  . Depression   . Diabetes mellitus   . History of tobacco use   . Hypertension     History reviewed. No pertinent surgical history.  Family History:  Family History  Problem Relation Age of Onset  . Heart failure Father     Social History:  reports that she has been smoking. She has been smoking about 2.00 packs per day. She does not have any smokeless tobacco history on file. She reports that she does not drink alcohol or use  drugs.  Additional Social History:     CIWA: CIWA-Ar BP: (!) 166/72 Pulse Rate: 97 COWS:    Allergies: No Known Allergies  Home Medications:  (Not in a hospital admission)  OB/GYN Status:  No LMP recorded. Patient has had a hysterectomy.  General Assessment Data Location of Assessment: [facility] TTS Assessment: In system Is this a Tele or Face-to-Face Assessment?: Tele Assessment Is this an Initial Assessment or a Re-assessment for this encounter?: Initial Assessment Patient Accompanied by:: N/A Language Other than English: No Living Arrangements: Other (Comment)(pt's  home) What gender do you identify as?: Female Marital status: Widowed Leslie FairlyMaiden name: Leslie Matthews Pregnancy Status: No Living Arrangements: Children(with adult son) Can pt return to current living arrangement?: Yes Admission Status: Involuntary Petitioner: Family member Is patient capable of signing voluntary admission?: Yes Referral Source: Self/Family/Friend Insurance type: [employer] medicare     Crisis Care Plan Living Arrangements: Children(with adult son) Name of Psychiatrist: never Name of Therapist: never  Education Status Is patient currently in school?: No Is the patient employed, unemployed or receiving disability?: Unemployed  Risk to self with the past 6 months Suicidal Ideation: No Has patient been a risk to self within the past 6 months prior to admission? : No Suicidal Intent: No Has patient had any suicidal intent within the past 6 months prior to admission? : No Is patient at risk for suicide?: Yes Suicidal Plan?: No Has patient had any suicidal plan within the past 6 months prior to admission? : No What has been your use of drugs/alcohol within the last 12 months?: no Previous Attempts/Gestures: No How many times?: 0 Other Self Harm Risks: "I just love cigarettes" Intentional Self Injurious Behavior: None Family Suicide History: No Recent stressful life event(s): Recent negative physical changes Persecutory voices/beliefs?: No Depression: No Depression Symptoms: Insomnia, Fatigue("I like to do crafts, internet, & puzzles") Substance abuse history and/or treatment for substance abuse?: No Suicide prevention information given to non-admitted patients: Not applicable  Risk to Others within the past 6 months Homicidal Ideation: No Does patient have any lifetime risk of violence toward others beyond the six months prior to admission? : No Thoughts of Harm to Others: No Current Homicidal Intent: No Current Homicidal Plan: No Access to Homicidal Means: No History of  harm to others?: No Assessment of Violence: None Noted Violent Behavior Description: n/a Does patient have access to weapons?: No(no firearms) Criminal Charges Pending?: No Does patient have a court date: No Is patient on probation?: No  Psychosis Hallucinations: None noted Delusions: None noted  Mental Status Report Appearance/Hygiene: Unremarkable Eye Contact: Good Motor Activity: Freedom of movement Speech: Logical/coherent Level of Consciousness: Alert Mood: Pleasant, Euthymic Affect: Apprehensive, Appropriate to circumstance Anxiety Level: Minimal Thought Processes: Relevant, Coherent Judgement: Unimpaired Orientation: Person, Place, Time, Situation Obsessive Compulsive Thoughts/Behaviors: None  Cognitive Functioning Concentration: Normal Memory: Unable to Assess Is patient IDD: No Insight: Good Impulse Control: Good Appetite: Fair Sleep: No Change(night sweats- hard to give # hrs slept) Vegetative Symptoms: None  ADLScreening [facility] Assessment Services) Patient's cognitive ability adequate to safely complete daily activities?: Yes Patient able to express need for assistance with ADLs?: Yes Independently performs ADLs?: Yes (appropriate for developmental age)  Prior Inpatient Therapy Prior Inpatient Therapy: No  Prior Outpatient Therapy Prior Outpatient Therapy: No Does patient have an ACCT team?: No Does patient have Intensive In-House Services?  : No Does patient have [facility] services? : No Does patient have P4CC services?: No  ADL Screening (condition at time of admission)  Patient's cognitive ability adequate to safely complete daily activities?: Yes Patient able to express need for assistance with ADLs?: Yes Independently performs ADLs?: Yes (appropriate for developmental age)                        Disposition: Per Hillery Jacks, NP, observe overnight for safety and stabilization & be seen by provider in am Disposition Initial Assessment  Completed for this Encounter: Yes Disposition of Patient: (observe overnight for safety & stabilization. Provider in am)  This service was provided via telemedicine using a 2-way, interactive audio and video technology.  Names of all persons participating in this telemedicine service and their role in this encounter.               Maxum Cassarino Suzan Nailer 10/11/2018 5:53 PM

## 2018-10-11 NOTE — ED Notes (Signed)
Spoke with son he states he walked in pts room and pt had a lid full of xanax and stated she was going to kill herself. Stating she wanted to die. Per family has been making comments about wanting to die  Pt is starting to drink again. SON [phone]

## 2018-10-12 ENCOUNTER — Emergency Department (HOSPITAL_COMMUNITY): Payer: Medicare HMO

## 2018-10-12 DIAGNOSIS — I4891 Unspecified atrial fibrillation: Secondary | ICD-10-CM | POA: Diagnosis not present

## 2018-10-12 DIAGNOSIS — F331 Major depressive disorder, recurrent, moderate: Secondary | ICD-10-CM | POA: Diagnosis not present

## 2018-10-12 DIAGNOSIS — I10 Essential (primary) hypertension: Secondary | ICD-10-CM | POA: Diagnosis not present

## 2018-10-12 DIAGNOSIS — E119 Type 2 diabetes mellitus without complications: Secondary | ICD-10-CM | POA: Diagnosis not present

## 2018-10-12 DIAGNOSIS — L03011 Cellulitis of right finger: Secondary | ICD-10-CM | POA: Diagnosis not present

## 2018-10-12 DIAGNOSIS — R69 Illness, unspecified: Secondary | ICD-10-CM | POA: Diagnosis not present

## 2018-10-12 DIAGNOSIS — R32 Unspecified urinary incontinence: Secondary | ICD-10-CM | POA: Diagnosis not present

## 2018-10-12 DIAGNOSIS — R45851 Suicidal ideations: Secondary | ICD-10-CM | POA: Diagnosis not present

## 2018-10-12 DIAGNOSIS — Z79899 Other long term (current) drug therapy: Secondary | ICD-10-CM | POA: Diagnosis not present

## 2018-10-12 DIAGNOSIS — R0989 Other specified symptoms and signs involving the circulatory and respiratory systems: Secondary | ICD-10-CM | POA: Diagnosis not present

## 2018-10-12 NOTE — ED Notes (Signed)
3 bags of belongings pulled from locker.

## 2018-10-12 NOTE — Progress Notes (Signed)
Pt. meets criteria for inpatient treatment per Reola Calkinsravis Money, NP.  Referred out to the following hospitals: [facility]  [facility]  [facility]     Disposition CSW will continue to follow for placement.  Timmothy EulerJean T. Kaylyn LimSutter, MSW, LCSWA Disposition Clinical Social Work [phone] (cell) [phone] (office)

## 2018-10-12 NOTE — Progress Notes (Signed)
CSW spoke with Morrie SheldonAshley at [facility]. They have received all requested medical documents but do not currently have a bed available. Morrie SheldonAshley asked that disposition call back tomorrow to see if openings were available then.   Wells GuilesSarah Rashaan Wyles, LCSW, LCAS Disposition CSW [facility] [phone]

## 2018-10-12 NOTE — ED Notes (Signed)
Patient eating food tray 

## 2018-10-12 NOTE — Progress Notes (Signed)
Patient is seen by me via tele-psych today.  Patient admits today that she was having suicidal thoughts and that she told her son that she needed help and that she needed to come to the hospital.  Patient states that she still feels that she needs help but does not want to stay in the hospital.  Patient tries to deny that she is having suicidal thoughts anymore, but patient can continues to look down or away from the camera when being interviewed.  Informed patient that I still feel that she needs assistance if she was seeking that yesterday and that she should come in to a inpatient unit to be further assessed and treated and she stated agreement.

## 2018-10-12 NOTE — ED Notes (Signed)
Pt given breakfast meal tray. 

## 2018-10-12 NOTE — ED Notes (Signed)
Patient decided she did not want to take 2 Tylenols after I opened it so I discarded the 1 Tylenol in the sharps.

## 2018-10-12 NOTE — Progress Notes (Signed)
[facility] Dr. Norm ParcelVictor Matthews is the attending provider.   Call report to [phone] [facility].   Pt is voluntary and can be [facility]. CSW spoke with pt who verbalized concern that the hospital was "far away" from her family but stated that she would not refuse treatment and will do, "what the doctors think I should do".  [facility] as soon as transportation is arranged.   Wells GuilesSarah Alannah Averhart, LCSW, LCAS Disposition CSW [facility] [phone]

## 2018-10-12 NOTE — Progress Notes (Signed)
Patient being reviewed by [facility].  Leslie Matthews from [facility] is requesting a copy of patient's 10/11/18 EKG and a chest x-ray.    CSW contacted [facility] ED RN, Leslie Matthews, and asked her to have a chest x-ray ordered.  CSW will fax EKG results to [facility].  Leslie EulerJean T. Kaylyn LimSutter, MSW, LCSWA Disposition Clinical Social Work [phone] (cell) [phone] (office)

## 2018-10-12 NOTE — ED Notes (Signed)
Family at bedside. 

## 2018-10-12 NOTE — ED Notes (Signed)
Belongings given to [facility]. Pt left ED with [facility].

## 2018-10-12 NOTE — ED Notes (Signed)
TTS consult in process. 

## 2018-10-12 NOTE — ED Notes (Signed)
Pt arrived to transport pt.

## 2018-10-12 NOTE — ED Notes (Signed)
[facility] notified for transport. Attempted to give report to [facility]. Stated they cannot take report until transport arrived to take pt.

## 2018-10-12 NOTE — ED Notes (Signed)
Patient transported to X-ray 

## 2018-10-12 NOTE — Progress Notes (Signed)
CSW requested patient's IVC paperwork and discovered no IVC paperwork is available.  CSW contacted pt's son to clarify whether he had, in fact, IVC'd her prior to her coming to the ED. Son explained that patient would not voluntarily come with him to the hospital so 911 was called and the sheriff brought her.  PATIENT IS NOT IVC'D AT THIS TIME.  Timmothy EulerJean T. Kaylyn LimSutter, MSW, LCSWA Disposition Clinical Social Work [phone] (cell) [phone] (office)

## 2018-10-18 ENCOUNTER — Other Ambulatory Visit: Payer: Self-pay

## 2018-10-18 ENCOUNTER — Encounter (HOSPITAL_COMMUNITY): Payer: Self-pay | Admitting: Emergency Medicine

## 2018-10-18 ENCOUNTER — Emergency Department (HOSPITAL_COMMUNITY)
Admission: EM | Admit: 2018-10-18 | Discharge: 2018-10-18 | Disposition: A | Discharge status: Home / Self Care | Payer: Medicare HMO | Attending: Emergency Medicine | Admitting: Emergency Medicine

## 2018-10-18 ENCOUNTER — Emergency Department (HOSPITAL_COMMUNITY): Payer: Medicare HMO

## 2018-10-18 DIAGNOSIS — R0602 Shortness of breath: Secondary | ICD-10-CM | POA: Diagnosis not present

## 2018-10-18 DIAGNOSIS — Z87891 Personal history of nicotine dependence: Secondary | ICD-10-CM | POA: Diagnosis not present

## 2018-10-18 DIAGNOSIS — I4891 Unspecified atrial fibrillation: Secondary | ICD-10-CM

## 2018-10-18 DIAGNOSIS — E119 Type 2 diabetes mellitus without complications: Secondary | ICD-10-CM | POA: Diagnosis not present

## 2018-10-18 DIAGNOSIS — J984 Other disorders of lung: Secondary | ICD-10-CM | POA: Diagnosis not present

## 2018-10-18 DIAGNOSIS — I10 Essential (primary) hypertension: Secondary | ICD-10-CM | POA: Diagnosis not present

## 2018-10-18 LAB — BASIC METABOLIC PANEL
ANION GAP: 8 (ref 5–15)
BUN: 7 mg/dL — ABNORMAL LOW (ref 8–23)
CO2: 25 mmol/L (ref 22–32)
Calcium: 10 mg/dL (ref 8.9–10.3)
Chloride: 95 mmol/L — ABNORMAL LOW (ref 98–111)
Creatinine, Ser: 0.56 mg/dL (ref 0.44–1.00)
GLUCOSE: 183 mg/dL — AB (ref 70–99)
POTASSIUM: 3.2 mmol/L — AB (ref 3.5–5.1)
Sodium: 128 mmol/L — ABNORMAL LOW (ref 135–145)

## 2018-10-18 LAB — CBC
HCT: 41.6 % (ref 36.0–46.0)
Hemoglobin: 14.2 g/dL (ref 12.0–15.0)
MCH: 29.4 pg (ref 26.0–34.0)
MCHC: 34.1 g/dL (ref 30.0–36.0)
MCV: 86.1 fL (ref 80.0–100.0)
NRBC: 0 % (ref 0.0–0.2)
Platelets: 350 10*3/uL (ref 150–400)
RBC: 4.83 MIL/uL (ref 3.87–5.11)
RDW: 13.2 % (ref 11.5–15.5)
WBC: 7.8 10*3/uL (ref 4.0–10.5)

## 2018-10-18 LAB — PROTIME-INR
INR: 0.9
PROTHROMBIN TIME: 12.1 s (ref 11.4–15.2)

## 2018-10-18 LAB — TSH: TSH: 1.514 u[IU]/mL (ref 0.350–4.500)

## 2018-10-18 LAB — BRAIN NATRIURETIC PEPTIDE: B Natriuretic Peptide: 178 pg/mL — ABNORMAL HIGH (ref 0.0–100.0)

## 2018-10-18 LAB — TROPONIN I

## 2018-10-18 LAB — MAGNESIUM: MAGNESIUM: 2 mg/dL (ref 1.7–2.4)

## 2018-10-18 MED ORDER — APIXABAN 5 MG PO TABS: 5.0000 mg | ORAL_TABLET | Freq: Two times a day (BID) | ORAL | 0 refills | Status: DC

## 2018-10-18 MED ORDER — APIXABAN 5 MG PO TABS
5.0000 mg | ORAL_TABLET | Freq: Once | ORAL | Status: AC
  Administered 2018-10-18: 5 mg via ORAL
  Filled 2018-10-18: qty 1

## 2018-10-18 MED ORDER — DILTIAZEM HCL ER COATED BEADS 120 MG PO CP24
120.0000 mg | ORAL_CAPSULE | Freq: Once | ORAL | Status: AC
  Administered 2018-10-18: 120 mg via ORAL
  Filled 2018-10-18: qty 1

## 2018-10-18 MED ORDER — DILTIAZEM HCL ER COATED BEADS 120 MG PO CP24: 120.0000 mg | ORAL_CAPSULE | Freq: Every day | ORAL | 0 refills | Status: DC

## 2018-10-18 NOTE — Discharge Instructions (Signed)
You were evaluated in the emergency department for increased shortness of breath.  You were found to be in atrial fibrillation which is a irregular heart rhythm that has a tendency to go fast.  We are starting you on some medication to control your heart rate and also a blood thinner to decrease your risk of stroke.  Cardiology office should contact you this week for follow-up.  If you experience any problems please return to the emergency department.  Information on my medicine - ELIQUIS (apixaban)  This medication education was reviewed with me or my healthcare representative as part of my discharge preparation.  The pharmacist that spoke with me during my hospital stay was:  Tad MooreSteven C Trang Bouse, [facility]  Why was Eliquis prescribed for you? Eliquis was prescribed for you to reduce the risk of a blood clot forming that can cause a stroke if you have a medical condition called atrial fibrillation (a type of irregular heartbeat).  What do You need to know about Eliquis ? Take your Eliquis TWICE DAILY - one tablet in the morning and one tablet in the evening with or without food. If you have difficulty swallowing the tablet whole please discuss with your pharmacist how to take the medication safely.  Take Eliquis exactly as prescribed by your doctor and DO NOT stop taking Eliquis without talking to the doctor who prescribed the medication.  Stopping may increase your risk of developing a stroke.  Refill your prescription before you run out.  After discharge, you should have regular check-up appointments with your healthcare provider that is prescribing your Eliquis.  In the future your dose may need to be changed if your kidney function or weight changes by a significant amount or as you get older.  What do you do if you miss a dose? If you miss a dose, take it as soon as you remember on the same day and resume taking twice daily.  Do not take more than one dose of ELIQUIS at the same time to make up a  missed dose.  Important Safety Information A possible side effect of Eliquis is bleeding. You should call your healthcare provider right away if you experience any of the following: ? Bleeding from an injury or your nose that does not stop. ? Unusual colored urine (red or dark brown) or unusual colored stools (red or black). ? Unusual bruising for unknown reasons. ? A serious fall or if you hit your head (even if there is no bleeding).  Some medicines may interact with Eliquis and might increase your risk of bleeding or clotting while on Eliquis. To help avoid this, consult your healthcare provider or pharmacist prior to using any new prescription or non-prescription medications, including herbals, vitamins, non-steroidal anti-inflammatory drugs (NSAIDs) and supplements.  This website has more information on Eliquis (apixaban): http://www.eliquis.com/eliquis/home

## 2018-10-18 NOTE — ED Triage Notes (Signed)
Pt reports she has been having trouble breathing for "quite a while".  Was [facility] on Friday and states she was having this problem while in the hospital but did not tell anyone.  Denies SI/HI today but states she is very nervous.

## 2018-10-18 NOTE — ED Provider Notes (Signed)
[facility] EMERGENCY DEPARTMENT Provider Note   CSN: [id number] Arrival date & time: 10/18/18  0900     History   Chief Complaint Chief Complaint  Patient presents with  . Shortness of Breath    HPI Leslie Matthews is a 72 y.o. female.  She was recently here for an overdose and just got discharged from mental health on Friday.  She said she is noticed some shortness of breath for a while but felt like it was worse since yesterday.  Something feels like catching in her chest and she also feels a little bit in her throat.  She denies any pain.  She said she has had a cough with some white sputum.  No fevers or chills.  No nausea no vomiting no diarrhea.  She has COPD and she said she smokes.  She started back on a medication she was on before.  She said she was given an inhaler a few years ago but does not use it.  She is not on oxygen or steroids.  The history is provided by the patient.  Shortness of Breath  This is a new problem. The problem occurs intermittently.The problem has not changed since onset.Associated symptoms include cough and sputum production. Pertinent negatives include no fever, no headaches, no coryza, no rhinorrhea, no sore throat, no hemoptysis, no wheezing, no chest pain, no syncope, no vomiting, no abdominal pain, no rash, no leg pain and no leg swelling. It is unknown what precipitated the problem. She has tried nothing for the symptoms. The treatment provided no relief. She has had prior hospitalizations. She has had prior ED visits. She has had no prior ICU admissions. Associated medical issues include COPD.    Past Medical History:  Diagnosis Date  . A-fib (HCC)    paroxysmal in 2008- spontaneously converted to NSR  . Depression   . Diabetes mellitus   . History of tobacco use   . Hypertension     Patient Active Problem List   Diagnosis Date Noted  . DM 01/30/2011  . DYSTHYMIC DISORDER 01/30/2011  . HYPERTENSION, BENIGN 01/30/2011  . PAROXYSMAL ATRIAL  FIBRILLATION 01/30/2011  . SYNCOPE AND COLLAPSE 01/30/2011  . PERS HX TOBACCO USE PRESENTING HAZARDS HEALTH 01/30/2011    History reviewed. No pertinent surgical history.   OB History   None      Home Medications    Prior to Admission medications   Medication Sig Start Date End Date Taking? Authorizing Provider  ALPRAZolam Prudy Feeler(XANAX) 0.5 MG tablet Take 0.5 mg by mouth at bedtime.  08/10/18   [provider]  benazepril (LOTENSIN) 5 MG tablet Take 5 mg by mouth daily. 09/01/18   [provider]  potassium chloride (KLOR-CON) 8 MEQ tablet Take 8 mEq by mouth daily. 09/21/18   [provider]  simvastatin (ZOCOR) 40 MG tablet Take 40 mg by mouth every evening. 07/23/18   [provider]    Family History Family History  Problem Relation Age of Onset  . Heart failure Father     Social History Social History   Tobacco Use  . Smoking status: Former Smoker    Packs/day: 2.00    Last attempt to quit: 10/04/2018    Years since quitting: 0.0  Substance Use Topics  . Alcohol use: No    Comment: HX quit 4 years ago  . Drug use: Never     Allergies   Patient has no known allergies.   Review of Systems Review of Systems  Constitutional: Negative for fever.  HENT: Negative for rhinorrhea and sore throat.   Eyes: Negative for visual disturbance.  Respiratory: Positive for cough, sputum production and shortness of breath. Negative for hemoptysis and wheezing.   Cardiovascular: Negative for chest pain, leg swelling and syncope.  Gastrointestinal: Negative for abdominal pain and vomiting.  Genitourinary: Negative for dysuria.  Musculoskeletal: Negative for back pain.  Skin: Negative for rash.  Neurological: Negative for headaches.     Physical Exam Updated Vital Signs BP (!) 172/110 (BP Location: Right Arm)   Pulse 97   Temp 97.8 F (36.6 C) (Oral)   Resp (!) 26   Ht 5\' 2"  (1.575 m)   Wt 62 kg   SpO2 97%   BMI 25.00 kg/m    Physical Exam  Constitutional: She appears well-developed and well-nourished. No distress.  HENT:  Head: Normocephalic and atraumatic.  Eyes: Conjunctivae are normal.  Neck: Neck supple.  Cardiovascular: Normal heart sounds and normal pulses. An irregularly irregular rhythm present. Tachycardia present.  No murmur heard. Pulmonary/Chest: Effort normal and breath sounds normal. No respiratory distress.  Abdominal: Soft. There is no tenderness.  Musculoskeletal: She exhibits no edema or deformity.  Neurological: She is alert.  Skin: Skin is warm and dry.  Psychiatric: She has a normal mood and affect.  Nursing note and vitals reviewed.  CHA2DS2/VAS Stroke Risk Points  Current as of 7 minutes ago     4 >= 2 Points: High Risk  1 - 1.99 Points: Medium Risk  0 Points: Low Risk    The patient's score has not changed in the past year.:  No Change     Details    This score determines the patient's risk of having a stroke if the  patient has atrial fibrillation.       Points Metrics  0 Has Congestive Heart Failure:  No    Current as of 7 minutes ago  0 Has Vascular Disease:  No    Current as of 7 minutes ago  1 Has Hypertension:  Yes    Current as of 7 minutes ago  1 Age:  56    Current as of 7 minutes ago  1 Has Diabetes:  Yes    Current as of 7 minutes ago  0 Had Stroke:  No  Had TIA:  No  Had thromboembolism:  No    Current as of 7 minutes ago  1 Female:  Yes    Current as of 7 minutes ago            ED Treatments / Results  Labs (all labs ordered are listed, but only abnormal results are displayed) Labs Reviewed  BASIC METABOLIC PANEL - Abnormal; Notable for the following components:      Result Value   Sodium 128 (*)    Potassium 3.2 (*)    Chloride 95 (*)    Glucose, Bld 183 (*)    BUN 7 (*)    All other components within normal limits  BRAIN NATRIURETIC PEPTIDE - Abnormal; Notable for the following components:   B Natriuretic Peptide 178.0 (*)    All  other components within normal limits  MAGNESIUM  CBC  TSH  TROPONIN I  PROTIME-INR    EKG EKG Interpretation  Date/Time:  Sunday October 18 2018 09:10:17 EST Ventricular Rate:  99 PR Interval:    QRS Duration: 92 QT Interval:  330 QTC Calculation: 424 R Axis:   -65 Text Interpretation:  Atrial fibrillation LAD,  consider left anterior fascicular block LVH with secondary repolarization abnormality ST depression, consider ischemia, diffuse lds afib replacing sinus 11/19 Confirmed by Meridee Score [phone]) on 10/18/2018 9:18:45 AM   Radiology Dg Chest 2 View  Result Date: 10/18/2018 CLINICAL DATA:  Trouble breathing for quite a while, history diabetes mellitus, hypertension, atrial fibrillation, former smoker EXAM: CHEST - 2 VIEW COMPARISON:  10/12/2018 FINDINGS: Normal heart size, mediastinal contours, and pulmonary vascularity. Atherosclerotic calcification aorta. Lungs appear hyperinflated but clear. Probable RIGHT nipple shadow unchanged. No pleural effusion or pneumothorax. Osseous structures unremarkable. IMPRESSION: Hyperinflated lungs without acute infiltrate. Electronically Signed   By: Ulyses Southward M.D.   On: 10/18/2018 10:13    Procedures Procedures (including critical care time)  Medications Ordered in ED Medications  apixaban (ELIQUIS) tablet 5 mg (5 mg Oral Given 10/18/18 1422)  diltiazem (CARDIZEM CD) 24 hr capsule 120 mg (120 mg Oral Given 10/18/18 1422)     Initial Impression / Assessment and Plan / ED Course  I have reviewed the triage vital signs and the nursing notes.  Pertinent labs & imaging results that were available during my care of the patient were reviewed by me and considered in my medical decision making (see chart for details).  Clinical Course as of Oct 18 1708  Wynelle Link Oct 18, 2018  9127 72 year old female with history of COPD smoker and appears to have a remote history of paroxysmal A. fib here with increased shortness of breath of an unclear  period of time but worsened over the last few days.  She appears to be in A. fib with a rate reasonably well controlled between 90 and 110.  Her oxygenation is good.  She is getting some screening labs and a chest x-ray.   [MB]  1135 Patient states she is feeling back to baseline.  Her lab work has her sodium a little low and her potassium a little low.  Troponin normal.  BNP slightly elevated but no baseline.  TSH normal.   [MB]  1136 Chest x-ray no infiltrate.  Heart rate remains in the 80s and she looks very comfortable.  Not requiring supplemental oxygen.  We will recheck another EKG but I do not think she needs to be admitted for A. fib because she is minimally symptomatic.  She would need to be put on anticoagulation and have close follow-up with cardiology.   [MB]  1221 Discussed with Dr. Rennis Golden from cardiology.  He agrees with the likely need to start her on some rate control and anticoagulation as we do not know a true time of onset.  He asked if we would make a judgment call on whether she is reliable enough to take anticoagulation in the setting of her recent overdose.  He would have the office reach out to her with a follow-up appointment with the [facility] cardiology group.   [MB]  1233 Discussed with pharmacy.  The recommendation is apixaban 5 twice daily and they will give her the card for the one month free.   [MB]  1413 We are still waiting for pharmacy to send up the medications for discharge.  Patient was updated on plan.   [MB]    Clinical Course User Index [MB] Terrilee Files, MD     Final Clinical Impressions(s) / ED Diagnoses   Final diagnoses:  Atrial fibrillation with rapid ventricular response (HCC)  SOB (shortness of breath)    ED Discharge Orders         Ordered  apixaban (ELIQUIS) 5 MG TABS tablet  2 times daily     10/18/18 1237    diltiazem (CARDIZEM CD) 120 MG 24 hr capsule  Daily     10/18/18 1237           Terrilee Files, MD 10/18/18  1710

## 2018-10-18 NOTE — ED Notes (Signed)
Pt ambulated around nurses station. Oxygen Sat stayed between 98-99%. Pt denies SOB while ambulation

## 2018-10-20 ENCOUNTER — Encounter (HOSPITAL_COMMUNITY): Payer: Self-pay | Admitting: Nurse Practitioner

## 2018-10-20 ENCOUNTER — Ambulatory Visit (HOSPITAL_COMMUNITY)
Admission: RE | Admit: 2018-10-20 | Discharge: 2018-10-20 | Disposition: A | Discharge status: Home / Self Care | Payer: Medicare HMO | Source: Ambulatory Visit | Attending: Nurse Practitioner | Admitting: Nurse Practitioner

## 2018-10-20 VITALS — BP 152/68 | HR 72 | Ht 62.0 in | Wt 113.0 lb

## 2018-10-20 DIAGNOSIS — Z87891 Personal history of nicotine dependence: Secondary | ICD-10-CM | POA: Diagnosis not present

## 2018-10-20 DIAGNOSIS — R69 Illness, unspecified: Secondary | ICD-10-CM | POA: Diagnosis not present

## 2018-10-20 DIAGNOSIS — Z7901 Long term (current) use of anticoagulants: Secondary | ICD-10-CM | POA: Insufficient documentation

## 2018-10-20 DIAGNOSIS — Z79899 Other long term (current) drug therapy: Secondary | ICD-10-CM | POA: Insufficient documentation

## 2018-10-20 DIAGNOSIS — F329 Major depressive disorder, single episode, unspecified: Secondary | ICD-10-CM | POA: Diagnosis not present

## 2018-10-20 DIAGNOSIS — I4891 Unspecified atrial fibrillation: Secondary | ICD-10-CM | POA: Diagnosis present

## 2018-10-20 DIAGNOSIS — Z8249 Family history of ischemic heart disease and other diseases of the circulatory system: Secondary | ICD-10-CM | POA: Diagnosis not present

## 2018-10-20 DIAGNOSIS — I10 Essential (primary) hypertension: Secondary | ICD-10-CM | POA: Insufficient documentation

## 2018-10-20 DIAGNOSIS — I480 Paroxysmal atrial fibrillation: Secondary | ICD-10-CM | POA: Diagnosis not present

## 2018-10-20 DIAGNOSIS — E119 Type 2 diabetes mellitus without complications: Secondary | ICD-10-CM | POA: Diagnosis not present

## 2018-10-21 NOTE — Progress Notes (Signed)
Primary Care Physician: Leslie Matthews, Xaje A, MD Referring Physician:[facility] ER f/u   Leslie Matthews is a 72 y.o. female with a h/o recent overdose, alcohol/tobacco abuse, depression, DM, COPD, that was seen  Iin [facility] ER 11/24 with new onset afib.  She was not on anticoagulation and there was some discussion per ER note if pt would be reliable to take with recent overdose. She was d/c on eliquis 5 mg bid and Cardizem 120 mg daily and in AFib.   In the afib office, she is accompanied by son. She is in SR. She is taking her meds as prescribed. She is very HOH and most of the history comes from the son. He states no smoking or alcohol x 2 weeks now.   Today, she denies symptoms of palpitations, chest pain, shortness of breath, orthopnea, PND, lower extremity edema, dizziness, presyncope, syncope, or neurologic sequela. The patient is tolerating medications without difficulties and is otherwise without complaint today.   Past Medical History:  Diagnosis Date  . A-fib (HCC)    paroxysmal in 2008- spontaneously converted to NSR  . Depression   . Diabetes mellitus   . History of tobacco use   . Hypertension    No past surgical history on file.  Current Outpatient Medications  Medication Sig Dispense Refill  . ALPRAZolam (XANAX) 0.5 MG tablet Take 0.5 mg by mouth at bedtime.     Marland Kitchen. apixaban (ELIQUIS) 5 MG TABS tablet Take 1 tablet (5 mg total) by mouth 2 (two) times daily. 60 tablet 0  . benazepril (LOTENSIN) 5 MG tablet Take 5 mg by mouth daily.  0  . calcium-vitamin D (OSCAL WITH D) 500-200 MG-UNIT tablet Take 1 tablet by mouth daily with breakfast.    . citalopram (CELEXA) 20 MG tablet Take 20 mg by mouth daily.  1  . diltiazem (CARDIZEM CD) 120 MG 24 hr capsule Take 1 capsule (120 mg total) by mouth daily. 30 capsule 0  . simvastatin (ZOCOR) 40 MG tablet Take 40 mg by mouth every evening.  0   No current facility-administered medications for this encounter.     No Known Allergies  Social History    Socioeconomic History  . Marital status: Widowed    Spouse name: Not on file  . Number of children: Not on file  . Years of education: Not on file  . Highest education level: Not on file  Occupational History  . Not on file  Social Needs  . Financial resource strain: Not on file  . Food insecurity:    Worry: Not on file    Inability: Not on file  . Transportation needs:    Medical: Not on file    Non-medical: Not on file  Tobacco Use  . Smoking status: Former Smoker    Packs/day: 2.00    Last attempt to quit: 10/04/2018    Years since quitting: 0.0  . Smokeless tobacco: Never Used  Substance and Sexual Activity  . Alcohol use: Yes    Comment: HX quit 4 years ago; relapse 2019  . Drug use: Never  . Sexual activity: Not on file  Lifestyle  . Physical activity:    Days per week: Not on file    Minutes per session: Not on file  . Stress: Not on file  Relationships  . Social connections:    Talks on phone: Not on file    Gets together: Not on file    Attends religious service: Not on file  Active member of club or organization: Not on file    Attends meetings of clubs or organizations: Not on file    Relationship status: Not on file  . Intimate partner violence:    Fear of current or ex partner: Not on file    Emotionally abused: Not on file    Physically abused: Not on file    Forced sexual activity: Not on file  Other Topics Concern  . Not on file  Social History Narrative  . Not on file    Family History  Problem Relation Age of Onset  . Heart failure Father     ROS- All systems are reviewed and negative except as per the HPI above  Physical Exam: Vitals:   10/20/18 1454  BP: (!) 152/68  Pulse: 72  Weight: 51.3 kg  Height: 5\' 2"  (1.575 m)   Wt Readings from Last 3 Encounters:  10/20/18 51.3 kg  10/18/18 62 kg  10/11/18 62.1 kg    Labs: Lab Results  Component Value Date   NA 128 (L) 10/18/2018   K 3.2 (L) 10/18/2018   CL 95 (L)  10/18/2018   CO2 25 10/18/2018   GLUCOSE 183 (H) 10/18/2018   BUN 7 (L) 10/18/2018   CREATININE 0.56 10/18/2018   CALCIUM 10.0 10/18/2018   MG 2.0 10/18/2018   Lab Results  Component Value Date   INR 0.90 10/18/2018   No results found for: CHOL, HDL, LDLCALC, TRIG   GEN- The patient is well appearing, alert and oriented x 3 today.   Head- normocephalic, atraumatic Eyes-  Sclera clear, conjunctiva pink Ears- hearing intact Oropharynx- clear Neck- supple, no JVP Lymph- no cervical lymphadenopathy Lungs- Clear to ausculation bilaterally, normal work of breathing Heart- Regular rate and rhythm, no murmurs, rubs or gallops, PMI not laterally displaced GI- soft, NT, ND, + BS Extremities- no clubbing, cyanosis, or edema MS- no significant deformity or atrophy Skin- no rash or lesion Psych- euthymic mood, full affect Neuro- strength and sensation are intact  EKG-NSR at 72 bpm, PR int 194 ms, qrs int 90 ms, qtc 418 ms Epic records reviewed    Assessment and Plan: 1. Paroxysmal afib Now in  SR Discussed afib in general and triggers Encouraged to continue with alcohol and smoking cessation Continue cardizem 120 mg a day  2. Chadsbvasc score of at least 4 Precautions regarding anticoagulation discussed with son and pt Risk vrs benefit discussed  Continue eliquis 5 mg bid   3. Recent overdose threat 11/17 Apparently pt threatened to take xanax overdose but did not go thru with it   Will obtain a cardiologist for her in the [place] area to be seen in 3-4 weeks  Leslie Matthews Afib [facility] [address] [place], [place] [address] [phone]

## 2018-10-27 DIAGNOSIS — R69 Illness, unspecified: Secondary | ICD-10-CM | POA: Diagnosis not present

## 2018-10-28 DIAGNOSIS — E7849 Other hyperlipidemia: Secondary | ICD-10-CM | POA: Diagnosis not present

## 2018-10-28 DIAGNOSIS — R69 Illness, unspecified: Secondary | ICD-10-CM | POA: Diagnosis not present

## 2018-10-28 DIAGNOSIS — Z681 Body mass index (BMI) 19 or less, adult: Secondary | ICD-10-CM | POA: Diagnosis not present

## 2018-10-28 DIAGNOSIS — I10 Essential (primary) hypertension: Secondary | ICD-10-CM | POA: Diagnosis not present

## 2018-10-28 DIAGNOSIS — I480 Paroxysmal atrial fibrillation: Secondary | ICD-10-CM | POA: Diagnosis not present

## 2018-11-02 DIAGNOSIS — R69 Illness, unspecified: Secondary | ICD-10-CM | POA: Diagnosis not present

## 2018-11-26 DIAGNOSIS — Z681 Body mass index (BMI) 19 or less, adult: Secondary | ICD-10-CM | POA: Diagnosis not present

## 2018-11-26 DIAGNOSIS — L658 Other specified nonscarring hair loss: Secondary | ICD-10-CM | POA: Diagnosis not present

## 2018-11-26 DIAGNOSIS — R197 Diarrhea, unspecified: Secondary | ICD-10-CM | POA: Diagnosis not present

## 2018-12-17 ENCOUNTER — Ambulatory Visit: Payer: Medicare HMO | Admitting: Cardiology

## 2018-12-17 ENCOUNTER — Encounter: Payer: Self-pay | Admitting: Cardiology

## 2018-12-17 VITALS — BP 142/72 | HR 82 | Ht 62.0 in | Wt 112.0 lb

## 2018-12-17 DIAGNOSIS — I480 Paroxysmal atrial fibrillation: Secondary | ICD-10-CM | POA: Diagnosis not present

## 2018-12-17 DIAGNOSIS — E119 Type 2 diabetes mellitus without complications: Secondary | ICD-10-CM | POA: Diagnosis not present

## 2018-12-17 DIAGNOSIS — I10 Essential (primary) hypertension: Secondary | ICD-10-CM | POA: Diagnosis not present

## 2018-12-17 MED ORDER — DILTIAZEM HCL ER COATED BEADS 120 MG PO CP24: 120.0000 mg | ORAL_CAPSULE | Freq: Every day | ORAL | 3 refills | Status: AC

## 2018-12-17 MED ORDER — APIXABAN 5 MG PO TABS: 5.0000 mg | ORAL_TABLET | Freq: Two times a day (BID) | ORAL | 11 refills | Status: AC

## 2018-12-17 NOTE — Patient Instructions (Signed)
Medication Instructions:  Resume Eliquis 5 mg twice a day  Resume Cardizem CD 120 mg daily  If you need a refill on your cardiac medications before your next appointment, please call your pharmacy.   Lab work: * If you have labs (blood work) drawn today and your tests are completely normal, you will receive your results only by: Marland Kitchen MyChart Message (if you have MyChart) OR . A paper copy in the mail If you have any lab test that is abnormal or we need to change your treatment, we will call you to review the results.  Testing/Procedures: None today  Follow-Up: At [facility], you and your health needs are our priority.  As part of our continuing mission to provide you with exceptional heart care, we have created designated Provider Care Teams.  These Care Teams include your primary Cardiologist (physician) and Advanced Practice Providers (APPs -  Physician Assistants and Nurse Practitioners) who all work together to provide you with the care you need, when you need it. You will need a follow up appointment in 6 months.  Please call our office 2 months in advance to schedule this appointment.  You may see Nona Dell, MD or one of the following Advanced Practice Providers on your designated Care Team:   Randall An, PA-C [facility]) . Jacolyn Reedy, PA-C [facility] Office)  Any Other Special Instructions Will Be Listed Below (If Applicable). None

## 2018-12-17 NOTE — Addendum Note (Signed)
Addended byAbelino Derrick R on: 12/17/2018 10:59 AM   Modules accepted: Orders

## 2018-12-17 NOTE — Progress Notes (Signed)
Cardiology Office Note  Date: 12/17/2018   ID: Leslie CherryKatie B Reyburn, DOB 11-Mar-1946, MRN [id number]  PCP: Toma DeitersHasanaj, Xaje A, MD  Evaluating Cardiologist: Nona DellSamuel McDowell, MD   Chief Complaint  Patient presents with  . Atrial Fibrillation    History of Present Illness: Leslie Matthews is a 73 y.o. female referred to establish cardiology follow-up by Ms. Carroll NP in the atrial fibrillation clinic.  She was diagnosed recently with paroxysmal atrial fibrillation, spontaneously converted to sinus rhythm on Cardizem CD.  CHADSVASC score is at least 4 and she was placed on Eliquis for stroke prophylaxis.  She presents today for follow-up.  She states that she ran out of both Eliquis and Cardizem CD and did not get them refilled.  She does not report any problems while taking the medications, has not had any subsequent palpitations or chest pain.  She does not report any spontaneous bleeding episodes.  I reviewed her lab work from November 2019.  We discussed rationale for continuing anticoagulation for reduction of stroke risk and also Cardizem CD.  Past Medical History:  Diagnosis Date  . Depression   . Essential hypertension   . Paroxysmal atrial fibrillation (HCC)   . Type 2 diabetes mellitus (HCC)     History reviewed. No pertinent surgical history.  Current Outpatient Medications  Medication Sig Dispense Refill  . ALPRAZolam (XANAX) 0.5 MG tablet Take 0.5 mg by mouth at bedtime.     . benazepril (LOTENSIN) 5 MG tablet Take 5 mg by mouth daily.  0  . Potassium Chloride CR (MICRO-K) 8 MEQ CPCR capsule CR Take 8 mEq by mouth.    . simvastatin (ZOCOR) 40 MG tablet Take 40 mg by mouth every evening.  0  . apixaban (ELIQUIS) 5 MG TABS tablet Take 1 tablet (5 mg total) by mouth 2 (two) times daily. 60 tablet 11  . diltiazem (CARDIZEM CD) 120 MG 24 hr capsule Take 1 capsule (120 mg total) by mouth daily. 90 capsule 3   No current facility-administered medications for this visit.    Allergies:   Patient has no known allergies.   Social History: The patient  reports that she has been smoking cigarettes. She has been smoking about 2.00 packs per day. She has never used smokeless tobacco. She reports current alcohol use. She reports that she does not use drugs.   Family History: The patient's family history includes Heart failure in her father.   ROS:  Please see the history of present illness. Otherwise, complete review of systems is positive for hearing loss, alopecia.  All other systems are reviewed and negative.   Physical Exam: VS:  BP (!) 142/72 (BP Location: Left Arm)   Pulse 82   Ht 5\' 2"  (1.575 m)   Wt 112 lb (50.8 kg)   SpO2 98%   BMI 20.49 kg/m , BMI Body mass index is 20.49 kg/m.  Wt Readings from Last 3 Encounters:  12/17/18 112 lb (50.8 kg)  10/20/18 113 lb (51.3 kg)  10/18/18 136 lb 11 oz (62 kg)    General: Chronically ill-appearing woman, appears comfortable at rest. HEENT: Conjunctiva and lids normal, oropharynx clear. Neck: Supple, no elevated JVP or carotid bruits, no thyromegaly. Lungs: Clear to auscultation, nonlabored breathing at rest. Cardiac: Regular rate and rhythm, no S3 or significant systolic murmur. Abdomen: Soft, nontender, bowel sounds present, no guarding or rebound. Extremities: No pitting edema, distal pulses 2+. Skin: Warm and dry. Musculoskeletal: No kyphosis. Neuropsychiatric: Alert and oriented x3,  affect grossly appropriate.  ECG: I personally reviewed the tracing from 10/20/2018 which showed sinus rhythm with increased voltage and left atrial enlargement.  Recent Labwork: 10/11/2018: ALT 13; AST 17 10/18/2018: B Natriuretic Peptide 178.0; BUN 7; Creatinine, Ser 0.56; Hemoglobin 14.2; Magnesium 2.0; Platelets 350; Potassium 3.2; Sodium 128; TSH 1.514   Other Studies Reviewed Today:  Chest x-ray 10/18/2018: FINDINGS: Normal heart size, mediastinal contours, and pulmonary vascularity.  Atherosclerotic calcification  aorta.  Lungs appear hyperinflated but clear.  Probable RIGHT nipple shadow unchanged.  No pleural effusion or pneumothorax.  Osseous structures unremarkable.  IMPRESSION: Hyperinflated lungs without acute infiltrate.  Assessment and Plan:  1.  Paroxysmal atrial fibrillation with CHADSVASC score of 4.  She is asymptomatic at this time and remains in sinus rhythm.  Plan to resume Eliquis at 5 mg twice daily and also Cardizem CD at 120 mg daily.  Follow-up CBC and BMET for next office visit.  2.  Essential hypertension, followed by Dr. Olena LeatherwoodHasanaj.  She is also on Lotensin.  3.  Type 2 diabetes mellitus, managed by diet.  She is followed by Dr. Olena LeatherwoodHasanaj.  4.  Tobacco abuse.  Current medicines were reviewed with the patient today.   Orders Placed This Encounter  Procedures  . CBC  . Basic Metabolic Panel (BMET)  . EKG 12-Lead    Disposition: Follow-up in 6 months.  Signed, Jonelle SidleSamuel G. McDowell, MD, [facility] 12/17/2018 10:53 AM    [facility] [address], [place], [place] [address] Phone: [phone]; Fax: [phone]

## 2019-01-18 DIAGNOSIS — R69 Illness, unspecified: Secondary | ICD-10-CM | POA: Diagnosis not present

## 2019-02-01 ENCOUNTER — Ambulatory Visit (INDEPENDENT_AMBULATORY_CARE_PROVIDER_SITE_OTHER): Payer: Medicare HMO | Admitting: Internal Medicine

## 2019-03-15 DIAGNOSIS — Z681 Body mass index (BMI) 19 or less, adult: Secondary | ICD-10-CM | POA: Diagnosis not present

## 2019-03-15 DIAGNOSIS — I10 Essential (primary) hypertension: Secondary | ICD-10-CM | POA: Diagnosis not present

## 2019-03-15 DIAGNOSIS — E785 Hyperlipidemia, unspecified: Secondary | ICD-10-CM | POA: Diagnosis not present

## 2019-03-15 DIAGNOSIS — Z6821 Body mass index (BMI) 21.0-21.9, adult: Secondary | ICD-10-CM | POA: Diagnosis not present

## 2019-03-15 DIAGNOSIS — L658 Other specified nonscarring hair loss: Secondary | ICD-10-CM | POA: Diagnosis not present

## 2019-06-17 DIAGNOSIS — Z1389 Encounter for screening for other disorder: Secondary | ICD-10-CM | POA: Diagnosis not present

## 2019-06-17 DIAGNOSIS — I10 Essential (primary) hypertension: Secondary | ICD-10-CM | POA: Diagnosis not present

## 2019-06-17 DIAGNOSIS — Z0000 Encounter for general adult medical examination without abnormal findings: Secondary | ICD-10-CM | POA: Diagnosis not present

## 2019-06-17 DIAGNOSIS — E785 Hyperlipidemia, unspecified: Secondary | ICD-10-CM | POA: Diagnosis not present

## 2019-06-17 DIAGNOSIS — J449 Chronic obstructive pulmonary disease, unspecified: Secondary | ICD-10-CM | POA: Diagnosis not present

## 2019-06-17 DIAGNOSIS — Z6820 Body mass index (BMI) 20.0-20.9, adult: Secondary | ICD-10-CM | POA: Diagnosis not present

## 2019-06-17 DIAGNOSIS — R69 Illness, unspecified: Secondary | ICD-10-CM | POA: Diagnosis not present

## 2019-07-03 IMAGING — DX DG CHEST 2V
2 series · 2 of 2 positions shown · non-contrast
Comparison: 10/12/2018

CLINICAL DATA: Trouble breathing for quite a while, history
diabetes mellitus, hypertension, atrial fibrillation, former smoker

EXAM:
CHEST - 2 VIEW

[chest pa]
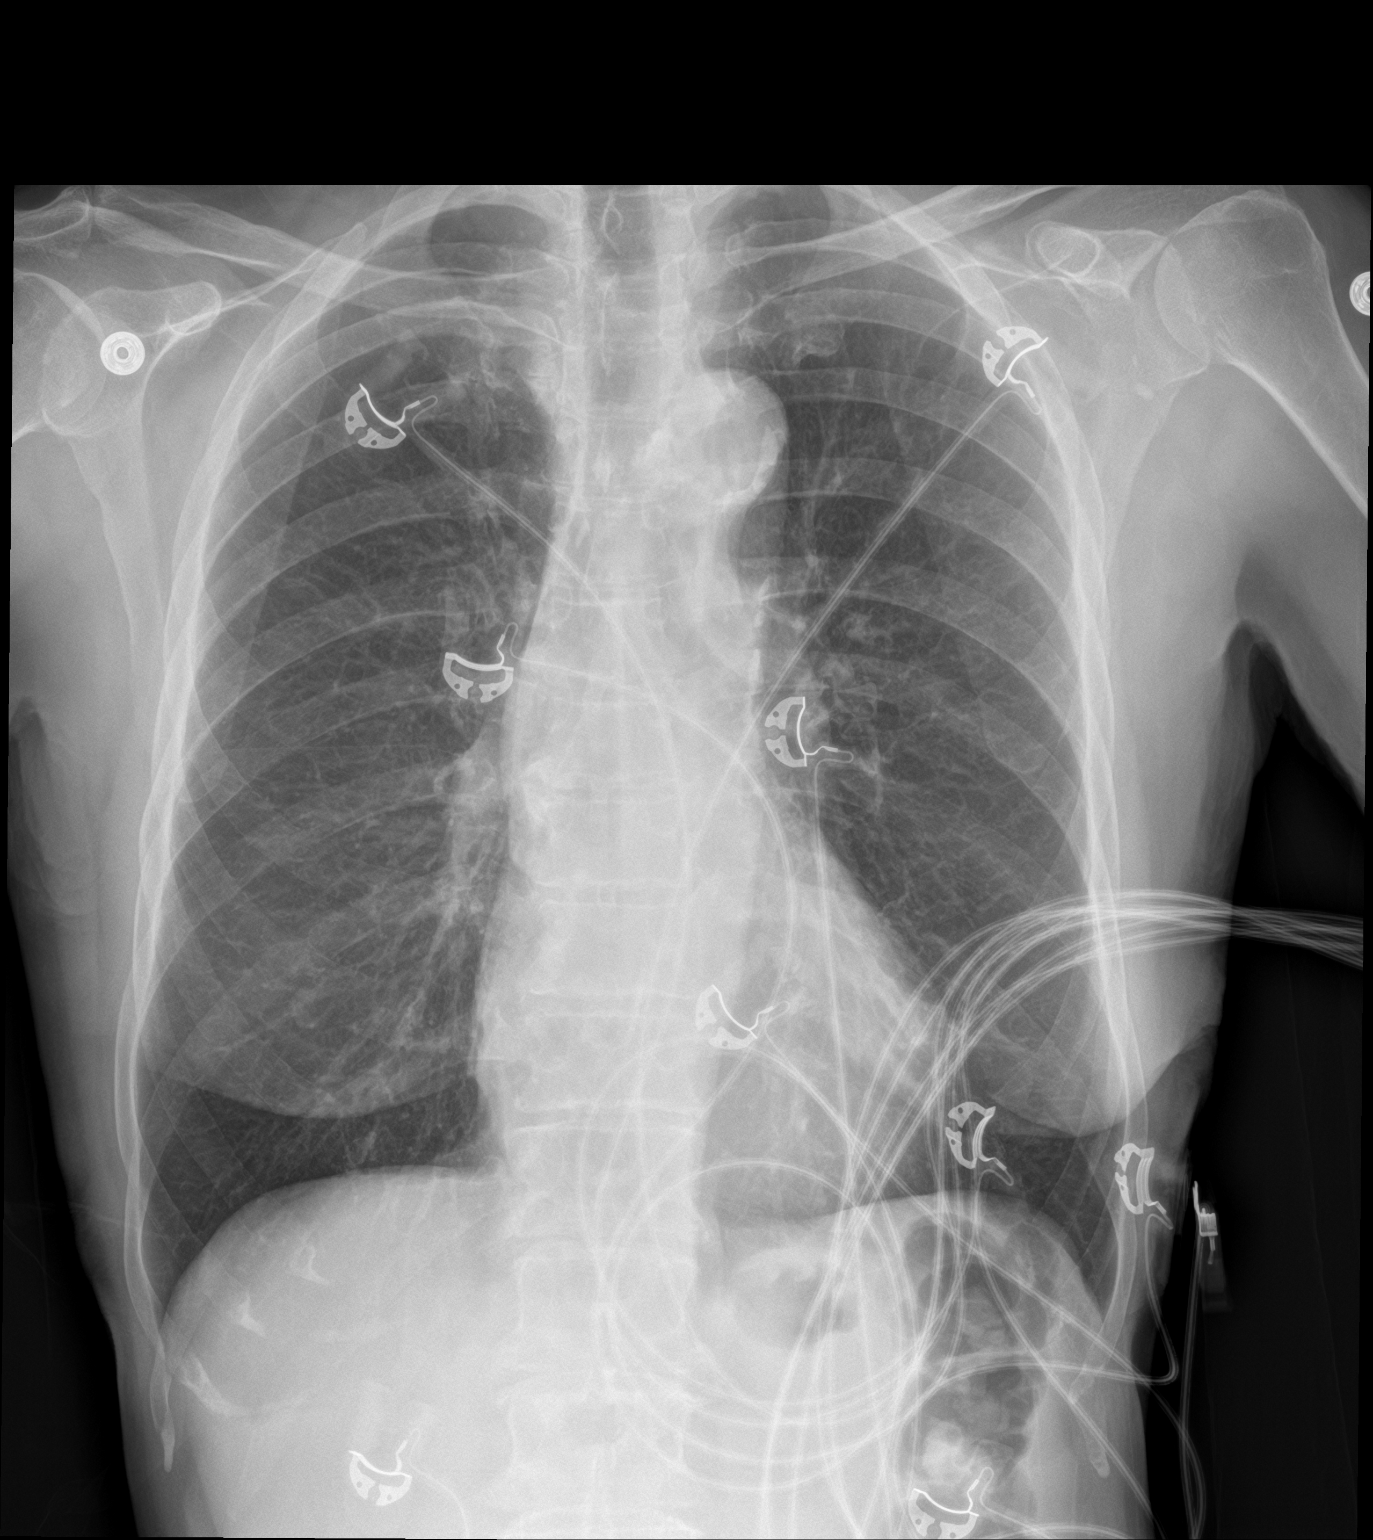

[chest lat]
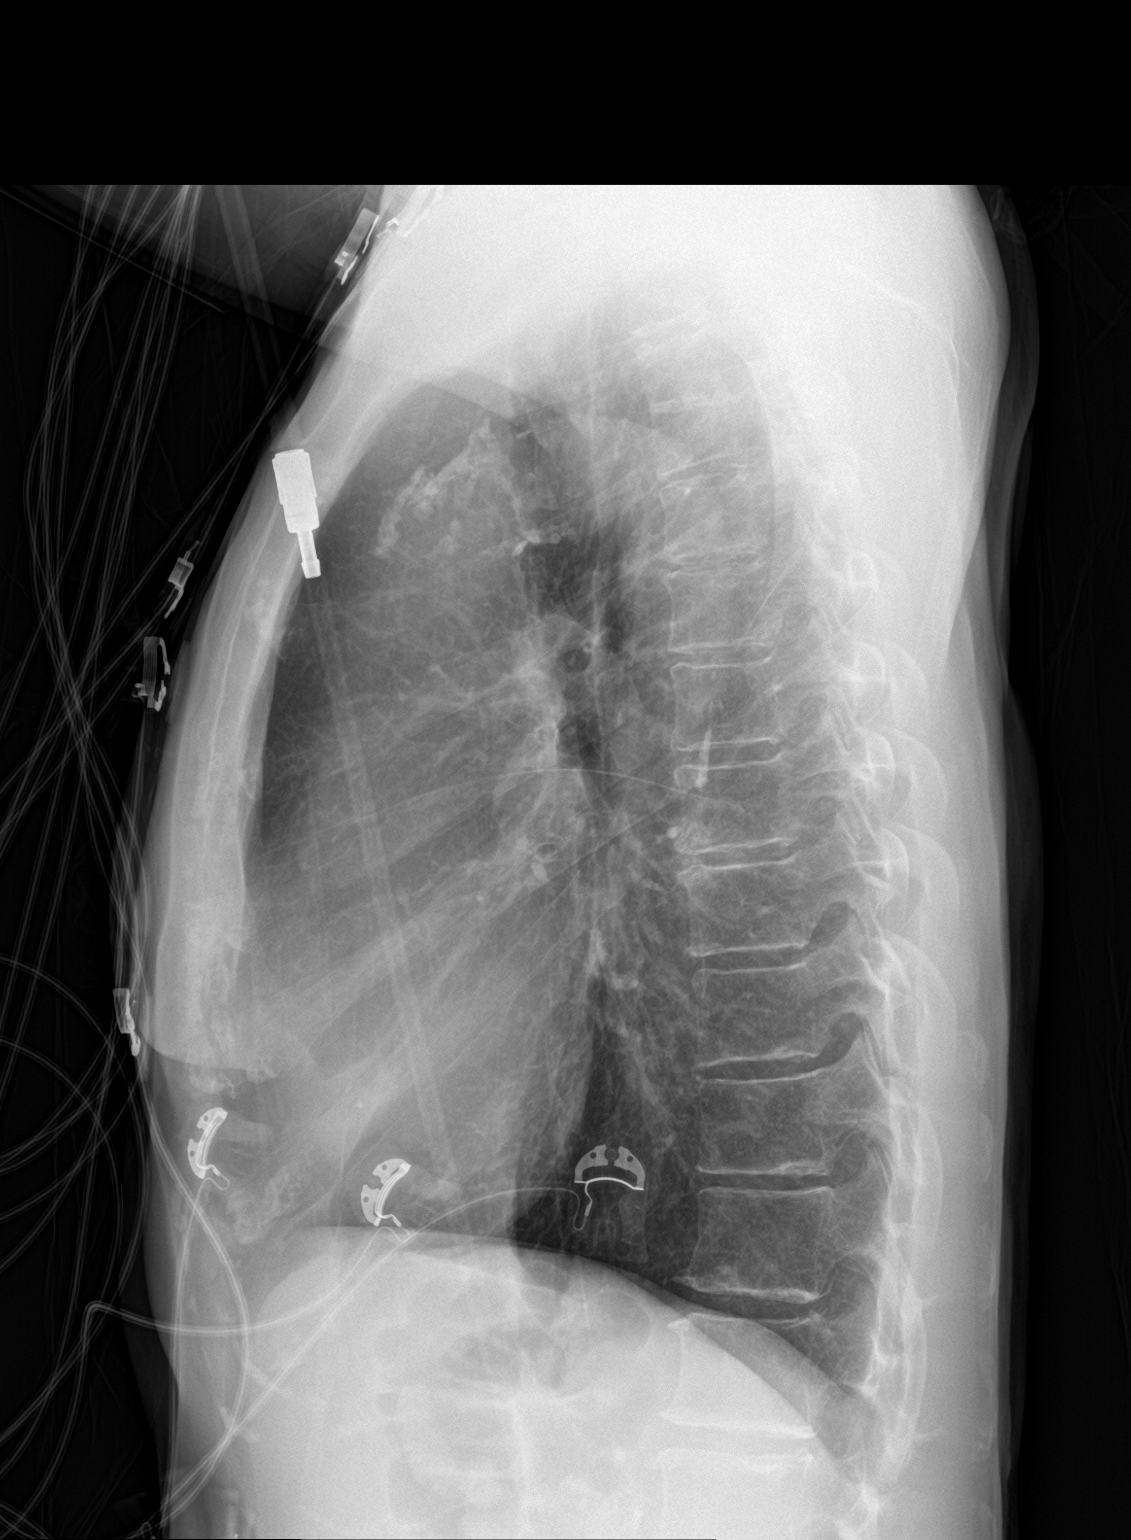

[2 of 2 positions shown; findings below may reference images not displayed]

FINDINGS: Normal heart size, mediastinal contours, and pulmonary vascularity.

Atherosclerotic calcification aorta.

Lungs appear hyperinflated but clear.

Probable RIGHT nipple shadow unchanged.

No pleural effusion or pneumothorax.

Osseous structures unremarkable.
IMPRESSION: Hyperinflated lungs without acute infiltrate.

## 2019-08-16 DIAGNOSIS — R69 Illness, unspecified: Secondary | ICD-10-CM | POA: Diagnosis not present

## 2019-09-03 ENCOUNTER — Telehealth: Payer: Self-pay | Admitting: Cardiology

## 2019-09-03 NOTE — Telephone Encounter (Signed)

## 2019-09-16 NOTE — Progress Notes (Deleted)
{  Choose 1 Note Type (Telehealth Visit or Telephone Visit):[phone]}   Date:  09/16/2019   ID:  Leslie Matthews, DOB 1946/01/27, MRN [id number]  {Patient [phone]::"Home"} {Provider Location:[phone]::"Home"}  PCP:  Neale Burly, MD  Cardiologist:  Rozann Lesches, MD Electrophysiologist:  None   Evaluation Performed:  {Choose Visit Type:[phone]::"Follow-Up Visit"}  Chief Complaint:  ***  History of Present Illness:    Leslie Matthews is a 73 y.o. female last seen in January.  We discussed follow-up lab work on [employer].  The patient {does/does not:200015} have symptoms concerning for COVID-19 infection (fever, chills, cough, or new shortness of breath).    Past Medical History:  Diagnosis Date  . Depression   . Essential hypertension   . Paroxysmal atrial fibrillation (HCC)   . Type 2 diabetes mellitus (HCC)    No past surgical history on file.   No outpatient medications have been marked as taking for the 09/17/19 encounter (Appointment) with Satira Sark, MD.     Allergies:   Patient has no known allergies.   Social History   Tobacco Use  . Smoking status: Current Every Day Smoker    Packs/day: 2.00    Types: Cigarettes    Last attempt to quit: 10/04/2018    Years since quitting: 0.9  . Smokeless tobacco: Never Used  . Tobacco comment: .5 pack daily  Substance Use Topics  . Alcohol use: Yes    Comment: HX quit 4 years ago; relapse 2019  . Drug use: Never     Family Hx: The patient's family history includes Heart failure in her father.  ROS:   Please see the history of present illness.    *** All other systems reviewed and are negative.   Prior CV studies:   The following studies were reviewed today:  Echocardiogram 12/24/2010 [facility]): Mild to moderate LVH with LVEF 60 to 65%, trace mitral regurgitation, mild elevation of the ascending aorta.  Labs/Other Tests and Data Reviewed:    EKG:  An ECG dated 10/20/2018 was  personally reviewed today and demonstrated:  Sinus rhythm with increased voltage and left atrial enlargement.  Recent Labs: 10/11/2018: ALT 13 10/18/2018: B Natriuretic Peptide 178.0; BUN 7; Creatinine, Ser 0.56; Hemoglobin 14.2; Magnesium 2.0; Platelets 350; Potassium 3.2; Sodium 128; TSH 1.514    Wt Readings from Last 3 Encounters:  12/17/18 112 lb (50.8 kg)  10/20/18 113 lb (51.3 kg)  10/18/18 136 lb 11 oz (62 kg)     Objective:    Vital Signs:  There were no vitals taken for this visit.   {HeartCare Virtual Exam (Optional):[phone]::"VITAL SIGNS:  reviewed"}  ASSESSMENT & PLAN:    1. ***  COVID-19 Education: The signs and symptoms of COVID-19 were discussed with the patient and how to seek care for testing (follow up with PCP or arrange E-visit).  ***The importance of social distancing was discussed today.  Time:   Today, I have spent *** minutes with the patient with telehealth technology discussing the above problems.     Medication Adjustments/Labs and Tests Ordered: Current medicines are reviewed at length with the patient today.  Concerns regarding medicines are outlined above.   Tests Ordered: No orders of the defined types were placed in this encounter.   Medication Changes: No orders of the defined types were placed in this encounter.   Follow Up:  {F/U [phone]} {follow up:15908}  Signed, Rozann Lesches, MD  09/16/2019 3:47 PM    [facility]

## 2019-09-17 ENCOUNTER — Telehealth: Payer: Medicare HMO | Admitting: Cardiology

## 2019-09-17 ENCOUNTER — Other Ambulatory Visit: Payer: Self-pay

## 2019-09-21 ENCOUNTER — Encounter: Payer: Self-pay | Admitting: Cardiology

## 2019-09-21 DIAGNOSIS — Z6821 Body mass index (BMI) 21.0-21.9, adult: Secondary | ICD-10-CM | POA: Diagnosis not present

## 2019-09-21 DIAGNOSIS — R69 Illness, unspecified: Secondary | ICD-10-CM | POA: Diagnosis not present

## 2019-09-21 DIAGNOSIS — E7849 Other hyperlipidemia: Secondary | ICD-10-CM | POA: Diagnosis not present

## 2019-09-21 DIAGNOSIS — J449 Chronic obstructive pulmonary disease, unspecified: Secondary | ICD-10-CM | POA: Diagnosis not present

## 2019-09-21 DIAGNOSIS — I10 Essential (primary) hypertension: Secondary | ICD-10-CM | POA: Diagnosis not present

## 2019-09-22 DIAGNOSIS — R69 Illness, unspecified: Secondary | ICD-10-CM | POA: Diagnosis not present

## 2019-12-28 DIAGNOSIS — Z6820 Body mass index (BMI) 20.0-20.9, adult: Secondary | ICD-10-CM | POA: Diagnosis not present

## 2019-12-28 DIAGNOSIS — E7849 Other hyperlipidemia: Secondary | ICD-10-CM | POA: Diagnosis not present

## 2019-12-28 DIAGNOSIS — R69 Illness, unspecified: Secondary | ICD-10-CM | POA: Diagnosis not present

## 2019-12-28 DIAGNOSIS — I10 Essential (primary) hypertension: Secondary | ICD-10-CM | POA: Diagnosis not present

## 2019-12-28 DIAGNOSIS — J449 Chronic obstructive pulmonary disease, unspecified: Secondary | ICD-10-CM | POA: Diagnosis not present

## 2020-02-08 DIAGNOSIS — R69 Illness, unspecified: Secondary | ICD-10-CM | POA: Diagnosis not present

## 2020-03-14 ENCOUNTER — Telehealth: Payer: Medicare HMO | Admitting: Cardiology

## 2020-03-14 ENCOUNTER — Telehealth: Payer: Self-pay | Admitting: Cardiology

## 2020-03-14 NOTE — Telephone Encounter (Signed)
  Patient Consent for Virtual Visit         Leslie Matthews has provided verbal consent on 03/14/2020 for a virtual visit (video or telephone).   CONSENT FOR VIRTUAL VISIT FOR:  Leslie Matthews  By participating in this virtual visit I agree to the following:  I hereby voluntarily request, consent and authorize [facility] and its employed or contracted physicians, physician assistants, nurse practitioners or other licensed health care professionals (the Practitioner), to provide me with telemedicine health care services (the "Services") as deemed necessary by the treating Practitioner. I acknowledge and consent to receive the Services by the Practitioner via telemedicine. I understand that the telemedicine visit will involve communicating with the Practitioner through live audiovisual communication technology and the disclosure of certain medical information by electronic transmission. I acknowledge that I have been given the opportunity to request an in-person assessment or other available alternative prior to the telemedicine visit and am voluntarily participating in the telemedicine visit.  I understand that I have the right to withhold or withdraw my consent to the use of telemedicine in the course of my care at any time, without affecting my right to future care or treatment, and that the Practitioner or I may terminate the telemedicine visit at any time. I understand that I have the right to inspect all information obtained and/or recorded in the course of the telemedicine visit and may receive copies of available information for a reasonable fee.  I understand that some of the potential risks of receiving the Services via telemedicine include:  Marland Kitchen Delay or interruption in medical evaluation due to technological equipment failure or disruption; . Information transmitted may not be sufficient (e.g. poor resolution of images) to allow for appropriate medical decision making by the Practitioner;  and/or  . In rare instances, security protocols could fail, causing a breach of personal health information.  Furthermore, I acknowledge that it is my responsibility to provide information about my medical history, conditions and care that is complete and accurate to the best of my ability. I acknowledge that Practitioner's advice, recommendations, and/or decision may be based on factors not within their control, such as incomplete or inaccurate data provided by me or distortions of diagnostic images or specimens that may result from electronic transmissions. I understand that the practice of medicine is not an exact science and that Practitioner makes no warranties or guarantees regarding treatment outcomes. I acknowledge that a copy of this consent can be made available to me via my patient portal [facility] MyChart), or I can request a printed copy by calling the office of [facility].    I understand that my insurance will be billed for this visit.   I have read or had this consent read to me. . I understand the contents of this consent, which adequately explains the benefits and risks of the Services being provided via telemedicine.  . I have been provided ample opportunity to ask questions regarding this consent and the Services and have had my questions answered to my satisfaction. . I give my informed consent for the services to be provided through the use of telemedicine in my medical care

## 2020-03-15 ENCOUNTER — Encounter: Payer: Self-pay | Admitting: Cardiology

## 2020-03-15 ENCOUNTER — Telehealth (INDEPENDENT_AMBULATORY_CARE_PROVIDER_SITE_OTHER): Payer: Medicare HMO | Admitting: Cardiology

## 2020-03-15 ENCOUNTER — Encounter: Payer: Self-pay | Admitting: *Deleted

## 2020-03-15 VITALS — Ht 61.0 in | Wt 120.0 lb

## 2020-03-15 DIAGNOSIS — I10 Essential (primary) hypertension: Secondary | ICD-10-CM

## 2020-03-15 DIAGNOSIS — I480 Paroxysmal atrial fibrillation: Secondary | ICD-10-CM

## 2020-03-15 NOTE — Patient Instructions (Signed)
Medication Instructions:  Continue all current medications.   Labwork: none  Testing/Procedures: none  Follow-Up: 6 months   Any Other Special Instructions Will Be Listed Below (If Applicable).   If you need a refill on your cardiac medications before your next appointment, please call your pharmacy.  

## 2020-03-15 NOTE — Progress Notes (Signed)
Virtual Visit via Telephone Note   This visit type was conducted due to national recommendations for restrictions regarding the COVID-19 Pandemic (e.g. social distancing) in an effort to limit this patient's exposure and mitigate transmission in our community.  Due to her co-morbid illnesses, this patient is at least at moderate risk for complications without adequate follow up.  This format is felt to be most appropriate for this patient at this time.  The patient did not have access to video technology/had technical difficulties with video requiring transitioning to audio format only (telephone).  All issues noted in this document were discussed and addressed.  No physical exam could be performed with this format.  Please refer to the patient's chart for her  consent to telehealth for [facility].   The patient was identified using 2 identifiers.  Date:  03/15/2020   ID:  ISATU MACINNES, DOB 06/19/1946, MRN [id number]  Patient Location: Home Provider Location: Office  PCP:  Toma Deiters, MD  Cardiologist:  Nona Dell, MD Electrophysiologist:  None   Evaluation Performed:  Follow-Up Visit  Chief Complaint:  Cardiac follow-up  History of Present Illness:    Leslie Matthews is a 74 y.o. female last seen in January 2020.  We spoke by phone today.  She does not report any palpitations or chest pain, no bleeding problems on Eliquis.  I reviewed her medication list, she reports compliance and no obvious intolerances.  She continues to follow with Dr. Olena Leatherwood, we are requesting interval lab work for review.  The patient does not have symptoms concerning for COVID-19 infection (fever, chills, cough, or new shortness of breath).    Past Medical History:  Diagnosis Date  . Depression   . Essential hypertension   . Paroxysmal atrial fibrillation (HCC)   . Type 2 diabetes mellitus (HCC)    History reviewed. No pertinent surgical history.   Current Meds  Medication Sig  .  apixaban (ELIQUIS) 5 MG TABS tablet Take 1 tablet (5 mg total) by mouth 2 (two) times daily.  . benazepril (LOTENSIN) 5 MG tablet Take 5 mg by mouth daily.  . citalopram (CELEXA) 20 MG tablet Take 20 mg by mouth daily.  . Potassium Chloride CR (MICRO-K) 8 MEQ CPCR capsule CR Take 8 mEq by mouth daily.   . simvastatin (ZOCOR) 40 MG tablet Take 40 mg by mouth every evening.     Allergies:   Patient has no known allergies.   ROS:   No syncope.  Prior CV studies:   The following studies were reviewed today:  No interval cardiac testing for review today.  Labs/Other Tests and Data Reviewed:    EKG:  An ECG dated 10/20/2018 was personally reviewed today and demonstrated:  Sinus rhythm with increased voltage and left atrial enlargement.  Recent Labs:  No interval lab work for review today.  Wt Readings from Last 3 Encounters:  03/15/20 120 lb (54.4 kg)  12/17/18 112 lb (50.8 kg)  10/20/18 113 lb (51.3 kg)     Objective:    Vital Signs:  Ht 5\' 1"  (1.549 m)   Wt 120 lb (54.4 kg)   BMI 22.67 kg/m    Unable to obtain vital signs today. No audible wheezing or coughing. Speech pattern normal.  ASSESSMENT & PLAN:    1.  Paroxysmal atrial fibrillation, CHA2DS2-VASc score is 4.  She continues on Eliquis and Cardizem CD.  Requesting interval lab work from Dr. .  She does not describe any significant  palpitations or bleeding problems.  2.  Essential hypertension, she remains on Lotensin and follows with Dr. Sherrie Sport.   Time:   Today, I have spent 5 minutes with the patient with telehealth technology discussing the above problems.     Medication Adjustments/Labs and Tests Ordered: Current medicines are reviewed at length with the patient today.  Concerns regarding medicines are outlined above.   Tests Ordered: No orders of the defined types were placed in this encounter.   Medication Changes: No orders of the defined types were placed in this encounter.   Follow Up:   In Person 6 months in the [place] office.  Signed, Rozann Lesches, MD  03/15/2020 4:30 PM    [place]

## 2020-03-28 DIAGNOSIS — R69 Illness, unspecified: Secondary | ICD-10-CM | POA: Diagnosis not present

## 2020-03-28 DIAGNOSIS — J449 Chronic obstructive pulmonary disease, unspecified: Secondary | ICD-10-CM | POA: Diagnosis not present

## 2020-03-28 DIAGNOSIS — Z131 Encounter for screening for diabetes mellitus: Secondary | ICD-10-CM | POA: Diagnosis not present

## 2020-03-28 DIAGNOSIS — E7849 Other hyperlipidemia: Secondary | ICD-10-CM | POA: Diagnosis not present

## 2020-03-28 DIAGNOSIS — Z6820 Body mass index (BMI) 20.0-20.9, adult: Secondary | ICD-10-CM | POA: Diagnosis not present

## 2020-03-28 DIAGNOSIS — I10 Essential (primary) hypertension: Secondary | ICD-10-CM | POA: Diagnosis not present

## 2020-06-29 DIAGNOSIS — Z1331 Encounter for screening for depression: Secondary | ICD-10-CM | POA: Diagnosis not present

## 2020-06-29 DIAGNOSIS — J449 Chronic obstructive pulmonary disease, unspecified: Secondary | ICD-10-CM | POA: Diagnosis not present

## 2020-06-29 DIAGNOSIS — Z0000 Encounter for general adult medical examination without abnormal findings: Secondary | ICD-10-CM | POA: Diagnosis not present

## 2020-06-29 DIAGNOSIS — Z6820 Body mass index (BMI) 20.0-20.9, adult: Secondary | ICD-10-CM | POA: Diagnosis not present

## 2020-06-29 DIAGNOSIS — E7849 Other hyperlipidemia: Secondary | ICD-10-CM | POA: Diagnosis not present

## 2020-06-29 DIAGNOSIS — E119 Type 2 diabetes mellitus without complications: Secondary | ICD-10-CM | POA: Diagnosis not present

## 2020-06-29 DIAGNOSIS — R69 Illness, unspecified: Secondary | ICD-10-CM | POA: Diagnosis not present

## 2020-06-29 DIAGNOSIS — I10 Essential (primary) hypertension: Secondary | ICD-10-CM | POA: Diagnosis not present

## 2020-07-14 DIAGNOSIS — R609 Edema, unspecified: Secondary | ICD-10-CM | POA: Diagnosis not present

## 2020-07-14 DIAGNOSIS — I499 Cardiac arrhythmia, unspecified: Secondary | ICD-10-CM | POA: Diagnosis not present

## 2020-07-14 DIAGNOSIS — R0689 Other abnormalities of breathing: Secondary | ICD-10-CM | POA: Diagnosis not present

## 2020-07-14 DIAGNOSIS — R0902 Hypoxemia: Secondary | ICD-10-CM | POA: Diagnosis not present

## 2020-07-14 DIAGNOSIS — R000 Tachycardia, unspecified: Secondary | ICD-10-CM | POA: Diagnosis not present

## 2020-07-26 DEATH — deceased

## 2020-09-18 ENCOUNTER — Ambulatory Visit: Payer: Medicare HMO | Admitting: Cardiology

## 2020-09-18 NOTE — Progress Notes (Deleted)
Cardiology Office Note  Date: 09/18/2020   ID: Leslie Matthews, DOB 08-24-1946, MRN [id number]  PCP:  Toma Deiters, MD  Cardiologist:  Nona Dell, MD Electrophysiologist:  None   No chief complaint on file.   History of Present Illness: Leslie Matthews is a 74 y.o. female last assessed via telehealth encounter in April.  Past Medical History:  Diagnosis Date  . Depression   . Essential hypertension   . Paroxysmal atrial fibrillation (HCC)   . Type 2 diabetes mellitus (HCC)     No past surgical history on file.  Current Outpatient Medications  Medication Sig Dispense Refill  . apixaban (ELIQUIS) 5 MG TABS tablet Take 1 tablet (5 mg total) by mouth 2 (two) times daily. 60 tablet 11  . benazepril (LOTENSIN) 5 MG tablet Take 5 mg by mouth daily.  0  . citalopram (CELEXA) 20 MG tablet Take 20 mg by mouth daily.    Marland Kitchen diltiazem (CARDIZEM CD) 120 MG 24 hr capsule Take 1 capsule (120 mg total) by mouth daily. (Patient not taking: Reported on 03/15/2020) 90 capsule 3  . Potassium Chloride CR (MICRO-K) 8 MEQ CPCR capsule CR Take 8 mEq by mouth daily.     . simvastatin (ZOCOR) 40 MG tablet Take 40 mg by mouth every evening.  0   No current facility-administered medications for this visit.   Allergies:  Patient has no known allergies.   Social History: The patient  reports that she has been smoking cigarettes. She has been smoking about 1.00 pack per day. She has never used smokeless tobacco. She reports current alcohol use. She reports that she does not use drugs.   Family History: The patient's family history includes Heart failure in her father.   ROS:  Please see the history of present illness. Otherwise, complete review of systems is positive for {NONE DEFAULTED:18576::"none"}.  All other systems are reviewed and negative.   Physical Exam: VS:  There were no vitals taken for this visit., BMI There is no height or weight on file to calculate BMI.  Wt Readings from Last 3  Encounters:  03/15/20 120 lb (54.4 kg)  12/17/18 112 lb (50.8 kg)  10/20/18 113 lb (51.3 kg)    General: Patient appears comfortable at rest. HEENT: Conjunctiva and lids normal, oropharynx clear with moist mucosa. Neck: Supple, no elevated JVP or carotid bruits, no thyromegaly. Lungs: Clear to auscultation, nonlabored breathing at rest. Cardiac: Regular rate and rhythm, no S3 or significant systolic murmur, no pericardial rub. Abdomen: Soft, nontender, no hepatomegaly, bowel sounds present, no guarding or rebound. Extremities: No pitting edema, distal pulses 2+. Skin: Warm and dry. Musculoskeletal: No kyphosis. Neuropsychiatric: Alert and oriented x3, affect grossly appropriate.  ECG:  An ECG dated 10/20/2018 was personally reviewed today and demonstrated:  Sinus rhythm with increased voltage and left atrial enlargement.  Recent Labwork:  October 2020: Cholesterol 175, triglycerides 79, HDL 78, LDL 82, BUN 8, creatinine 3.87, potassium 4.7  Other Studies Reviewed Today:  No interval lab work for review today.  Assessment and Plan:    Medication Adjustments/Labs and Tests Ordered: Current medicines are reviewed at length with the patient today.  Concerns regarding medicines are outlined above.   Tests Ordered: No orders of the defined types were placed in this encounter.   Medication Changes: No orders of the defined types were placed in this encounter.   Disposition:  Follow up {follow up:15908}  Signed, Jonelle Sidle, MD, [facility] 09/18/2020 3:08  PM    [facility] at [place], [place], [place] [address] Phone: [phone]; Fax: [phone]
